# Patient Record
Sex: Female | Born: 1985 | Race: Black or African American | Hispanic: No | Marital: Single | Smoking: Never smoker
Health system: Southern US, Community
[De-identification: ages and names within clinical notes are randomized; demographics above are authoritative.]

## PROBLEM LIST (undated history)

## (undated) DIAGNOSIS — N809 Endometriosis, unspecified: Secondary | ICD-10-CM

---

## 2013-03-10 DIAGNOSIS — F1210 Cannabis abuse, uncomplicated: Secondary | ICD-10-CM | POA: Insufficient documentation

## 2013-05-26 DIAGNOSIS — Z309 Encounter for contraceptive management, unspecified: Secondary | ICD-10-CM | POA: Insufficient documentation

## 2013-06-28 DIAGNOSIS — Z9851 Tubal ligation status: Secondary | ICD-10-CM | POA: Insufficient documentation

## 2014-11-16 DIAGNOSIS — N806 Endometriosis in cutaneous scar: Secondary | ICD-10-CM | POA: Insufficient documentation

## 2014-11-16 DIAGNOSIS — N920 Excessive and frequent menstruation with regular cycle: Secondary | ICD-10-CM | POA: Insufficient documentation

## 2014-11-16 DIAGNOSIS — N643 Galactorrhea not associated with childbirth: Secondary | ICD-10-CM | POA: Insufficient documentation

## 2014-11-16 DIAGNOSIS — D500 Iron deficiency anemia secondary to blood loss (chronic): Secondary | ICD-10-CM | POA: Insufficient documentation

## 2015-01-03 DIAGNOSIS — E0590 Thyrotoxicosis, unspecified without thyrotoxic crisis or storm: Secondary | ICD-10-CM | POA: Insufficient documentation

## 2020-09-05 ENCOUNTER — Ambulatory Visit
Admission: RE | Admit: 2020-09-05 | Discharge: 2020-09-05 | Disposition: A | Discharge status: Home / Self Care | Payer: Self-pay | Source: Ambulatory Visit | Attending: Emergency Medicine | Admitting: Emergency Medicine

## 2020-09-05 ENCOUNTER — Other Ambulatory Visit: Payer: Self-pay

## 2020-09-05 VITALS — BP 112/75 | HR 93 | Temp 98.3°F | Resp 19

## 2020-09-05 DIAGNOSIS — R0602 Shortness of breath: Secondary | ICD-10-CM

## 2020-09-05 DIAGNOSIS — K2900 Acute gastritis without bleeding: Secondary | ICD-10-CM

## 2020-09-05 HISTORY — DX: Endometriosis, unspecified: N80.9

## 2020-09-05 MED ORDER — ALUM & MAG HYDROXIDE-SIMETH 200-200-20 MG/5ML PO SUSP
30.0000 mL | Freq: Once | ORAL | Status: AC
  Administered 2020-09-05: 30 mL via ORAL

## 2020-09-05 MED ORDER — PANTOPRAZOLE SODIUM 40 MG PO TBEC: 40.0000 mg | DELAYED_RELEASE_TABLET | Freq: Two times a day (BID) | ORAL | 0 refills | Status: DC

## 2020-09-05 MED ORDER — LIDOCAINE VISCOUS HCL 2 % MT SOLN
15.0000 mL | Freq: Once | OROMUCOSAL | Status: AC
  Administered 2020-09-05: 15 mL via ORAL

## 2020-09-05 MED ORDER — SUCRALFATE 1 GM/10ML PO SUSP: 1.0000 g | Freq: Four times a day (QID) | ORAL | 0 refills | Status: DC

## 2020-09-05 MED ORDER — FAMOTIDINE 20 MG PO TABS: 20.0000 mg | ORAL_TABLET | Freq: Two times a day (BID) | ORAL | 0 refills | Status: DC

## 2020-09-05 NOTE — ED Triage Notes (Signed)
Pt presents with complaints of indigestion that started on Monday. Reports she ate a sand which and laid down. Reports she immediately felt hungry even though she had just ate. States she has continued to have a "hungry feeling" in the top of her stomach all day long. Reports the sensation is worse when she takes a deep breath or pushes in on her abdomen. Reports she feels relief with belching. Denies taking any otc medications.

## 2020-09-05 NOTE — ED Provider Notes (Signed)
HPI  SUBJECTIVE:  Christina Huang is a 34 y.o. female who presents with 3 days of intermittent upper abdominal pain described as "hunger pains".  She states it is located in the epigastric/ substernal region, it is sharp, burning, occasionally radiating to both shoulders.  It does not radiate into her jaw or down her arm. it lasts minutes, but will come and go all day.  She reports waterbrash, belching.  She states it starts while she is eating.  She reports some shortness of breath when the pain is at its worst.  States that she was taking a lot of Aleve last week due to menstrual pain.  No accompanying diaphoresis, nausea, vomiting, coughing, wheezing, palpitations, or other abdominal pain.  No abdominal distention.  No exertional component.  No change in physical activity, recent trauma to the area, recent heavy lifting.  She tried Tylenol and sleeping.  Symptoms are better with belching.  They are worse with taking a deep breath in, eating, lying down, palpation and twisting her torso to the left.  She has a past medical history of endometriosis and C-sections x4.  No history of GERD, peptic ulcer disease, atrial fibrillation, hypercoagulability, exogenous estrogen, diabetes, hypertension, hypercholesterolemia smoking, obesity, DVT, PE, mesenteric ischemia, EtOH use..  Family history significant for an aunt with an MI at age 17.  No first-degree relatives with early MI.  LMP: Last week.  Denies possibility of being pregnant.  She is status post bilateral tubal ligation.  PMD: None.   Past Medical History:  Diagnosis Date  . Endometriosis     Past Surgical History:  Procedure Laterality Date  . CESAREAN SECTION WITH BILATERAL TUBAL LIGATION      Family History  Problem Relation Age of Onset  . Healthy Mother   . Healthy Father     Social History   Tobacco Use  . Smoking status: Never Smoker  . Smokeless tobacco: Never Used  Substance Use Topics  . Alcohol use: Never  . Drug use: Yes     Types: Marijuana    No current facility-administered medications for this encounter.  Current Outpatient Medications:  .  famotidine (PEPCID) 20 MG tablet, Take 1 tablet (20 mg total) by mouth 2 (two) times daily., Disp: 60 tablet, Rfl: 0 .  pantoprazole (PROTONIX) 40 MG tablet, Take 1 tablet (40 mg total) by mouth 2 (two) times daily., Disp: 30 tablet, Rfl: 0 .  sucralfate (CARAFATE) 1 GM/10ML suspension, Take 10 mLs (1 g total) by mouth 4 (four) times daily. 10 mL before meals and before bedtime, Disp: 240 mL, Rfl: 0  No Known Allergies   ROS  As noted in HPI.   Physical Exam  BP 112/75   Pulse 93   Temp 98.3 F (36.8 C)   Resp 19   SpO2 97%   Constitutional: Well developed, well nourished, no acute distress Eyes:  EOMI, conjunctiva normal bilaterally HENT: Normocephalic, atraumatic,mucus membranes moist Respiratory: Normal inspiratory effort lungs clear bilaterally Cardiovascular: Normal rate, regular rhythm.  Positive tenderness along the lower anterior ribs that reproduces her pain. GI: Positive epigastric tenderness that reproduces her pain.  No other abdominal tenderness.  No guarding, rebound, active bowel sounds.  No pulsatile palpable masses.  skin: No rash, skin intact Musculoskeletal: no deformities Neurologic: Alert & oriented x 3, no focal neuro deficits Psychiatric: Speech and behavior appropriate   ED Course   Medications  alum & mag hydroxide-simeth (MAALOX/MYLANTA) 200-200-20 MG/5ML suspension 30 mL (30 mLs Oral Given 09/05/20 1811)  And  lidocaine (XYLOCAINE) 2 % viscous mouth solution 15 mL (15 mLs Oral Given 09/05/20 1812)    Orders Placed This Encounter  Procedures  . Ambulatory referral to Gastroenterology    Referral Priority:   Routine    Referral Type:   Consultation    Referral Reason:   Specialty Services Required    Number of Visits Requested:   1  . EKG 12-Lead    Standing Status:   Standing    Number of Occurrences:   1     No results found for this or any previous visit (from the past 24 hour(s)). No results found.  ED Clinical Impression  1. Acute gastritis without hemorrhage, unspecified gastritis type      ED Assessment/Plan  EKG: Normal sinus rhythm, rate 67.  Normal axis, normal intervals.  No hypertrophy.  No ST-T wave changes.  No previous EKG for comparison.  Patient was symptomatic while  EKG was obtained.  Suspect a gastritis or PUD.  Doubt cardiac cause due to normal EKG with active symptoms.  Doubt mesenteric ischemia as she has no risk factors.  Gave GI cocktail here with significant improvement in symptoms.  Also sending home with Pepcid, Protonix, Carafate, GI follow-up if she does not get better in a week to 10 days.  Advised her to avoid NSAIDs.  will also provide primary care list for ongoing care.  Strict ER return precautions given.  Discussed MDM, treatment plan, and plan for follow-up with patient. Discussed sn/sx that should prompt return to the ED. patient agrees with plan.   Meds ordered this encounter  Medications  . AND Linked Order Group   . alum & mag hydroxide-simeth (MAALOX/MYLANTA) 200-200-20 MG/5ML suspension 30 mL   . lidocaine (XYLOCAINE) 2 % viscous mouth solution 15 mL  . famotidine (PEPCID) 20 MG tablet    Sig: Take 1 tablet (20 mg total) by mouth 2 (two) times daily.    Dispense:  60 tablet    Refill:  0  . pantoprazole (PROTONIX) 40 MG tablet    Sig: Take 1 tablet (40 mg total) by mouth 2 (two) times daily.    Dispense:  30 tablet    Refill:  0  . sucralfate (CARAFATE) 1 GM/10ML suspension    Sig: Take 10 mLs (1 g total) by mouth 4 (four) times daily. 10 mL before meals and before bedtime    Dispense:  240 mL    Refill:  0    *This clinic note was created using Scientist, clinical (histocompatibility and immunogenetics). Therefore, there may be occasional mistakes despite careful proofreading.   ?    Domenick Gong, MD 09/06/20 1528

## 2020-09-05 NOTE — Discharge Instructions (Addendum)
Do not take any more Aleve, ibuprofen, BC powder, or any other NSAID until your symptoms resolve.  You can try the Pepcid, Protonix.  Carafate will coat your stomach, but it may be expensive.  Follow with a primary care provider of your choice for routine care soon as you can, and will up with gastroenterology if you do not get better with this treatment in a week.  Here is a list of primary care providers who are taking new patients:  Dr. Elizabeth Sauer [address] [place] [address] [phone]  [facility] [address] [place], [place] [address] [phone]  [facility] [place] [address] [place] [address]  [phone]  [facility] [address] [place], [place] [address] [phone]  [facility] [address] [place]  [phone] [place], [place] [address]  Here are clinics/ other resources who will see you if you do not have insurance. Some have certain criteria that you must meet. Call them and find out what they are:  [facility]: [address], [place], [place] [address] Phone: [phone] Hours: First and Third Saturdays of each Month, 9 a.m. - 1 p.m.  [facility]: [address], [address] [place], [place] [address] Phone: [phone] Hours: Tuesday, 4 p.m. - 8 p.m. Thursday, 1 p.m. - 8 p.m. Wednesday, 9 a.m. - [facility] [address], [place], [place] [address] Phone: [phone] Pharmacy Phone Number: [phone] Dental Phone Number: [phone] [facility] Insurance Help: [phone]  Dental Hours: Monday - Thursday, 8 a.m. - 6 p.m.  [facility] [address], [place], [place] [address] Phone: [phone] Pharmacy Phone Number: [phone] [facility] Insurance Help: [phone]  [facility] [address] [place], [place] [address] Phone: [phone] Pharmacy Phone Number: [phone] [facility] Insurance Help:  [phone]  [facility] [address] [place], [place] [address] Phone: [phone] [facility] Insurance Help: [phone]   [facility] [address], [place], [place] [address] Phone: [phone]  Go to www.goodrx.com to look up your medications. This will give you a list of where you can find your prescriptions at the most affordable prices. Or ask the pharmacist what the cash price is, or if they have any other discount programs available to help make your medication more affordable. This can be less expensive than what you would pay with insurance.

## 2020-10-22 ENCOUNTER — Encounter: Payer: Self-pay | Admitting: *Deleted

## 2020-10-22 ENCOUNTER — Ambulatory Visit: Payer: Self-pay | Admitting: Gastroenterology

## 2021-07-06 ENCOUNTER — Ambulatory Visit: Payer: Self-pay

## 2021-07-07 ENCOUNTER — Other Ambulatory Visit: Payer: Self-pay

## 2021-07-07 ENCOUNTER — Ambulatory Visit
Admission: RE | Admit: 2021-07-07 | Discharge: 2021-07-07 | Disposition: A | Discharge status: Home / Self Care | Payer: 59 | Source: Ambulatory Visit | Attending: Emergency Medicine | Admitting: Emergency Medicine

## 2021-07-07 VITALS — BP 119/71 | HR 80 | Temp 98.2°F | Resp 14 | Ht 62.0 in | Wt 105.0 lb

## 2021-07-07 DIAGNOSIS — Z20822 Contact with and (suspected) exposure to covid-19: Secondary | ICD-10-CM | POA: Insufficient documentation

## 2021-07-07 DIAGNOSIS — J069 Acute upper respiratory infection, unspecified: Secondary | ICD-10-CM

## 2021-07-07 MED ORDER — IPRATROPIUM BROMIDE 0.06 % NA SOLN: 2.0000 | Freq: Four times a day (QID) | NASAL | 12 refills | Status: DC

## 2021-07-07 NOTE — ED Triage Notes (Signed)
Patient c/o headache, runny nose, congestion that started over a week ago.  Patient had a PCR test on Wed and was negative.  Patient reports low grade fevers off and on.

## 2021-07-07 NOTE — ED Provider Notes (Signed)
[facility]    CSN: [id number] Arrival date & time: 07/07/21  2440      History   Chief Complaint Chief Complaint  Patient presents with   Nasal Congestion   Headache    HPI Christina Huang is a 35 y.o. female.   HPI  50 old female here for evaluation of respiratory complaints.  Patient reports that she developed a headache, runny nose, and nasal congestion over a week ago.  She was tested 4 days ago and had a negative COVID PCR test.  She states that she just wants to be sure that she does not have anything infectious.  She states that she had a fever with a T-max of 102 that broke 2 days ago and has not had 1 since.  She is experiencing fatigue.  She indicates that she is having a yellow nasal discharge with some intermittent flecks of blood.  She is also complaining of a left sided headache and light sensitivity.  She did have a sore throat but that too has resolved and she is also complaining of intermittent diarrhea.  She denies ear pain, cough, shortness of breath or wheezing, nausea, or vomiting.  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      Home Medications    Prior to Admission medications   Medication Sig Start Date End Date Taking? Authorizing Provider  ipratropium (ATROVENT) 0.06 % nasal spray Place 2 sprays into both nostrils 4 (four) times daily. 07/07/21  Yes Becky Augusta, NP  levonorgestrel (MIRENA) 20  MCG/DAY IUD 1 each by Intrauterine route once.   Yes [provider]    Family History Family History  Problem Relation Age of Onset   Healthy Mother    Healthy Father     Social History Social History   Tobacco Use   Smoking status: Never   Smokeless tobacco: Never  Vaping Use   Vaping Use: Never used  Substance Use Topics   Alcohol use: Never   Drug use: Yes    Types: Marijuana     Allergies   Patient has no known allergies.   Review of Systems Review of Systems  Constitutional:  Positive for fatigue and fever. Negative for activity change and appetite change.  HENT:  Positive for congestion, rhinorrhea and sore throat. Negative for ear discharge and ear pain.   Respiratory:  Negative for cough, shortness of breath and wheezing.   Gastrointestinal:  Positive for diarrhea. Negative for nausea and vomiting.  Musculoskeletal:  Negative for arthralgias and neck pain.  Skin:  Negative for rash.  Neurological:  Positive for headaches.  Hematological: Negative.   Psychiatric/Behavioral: Negative.      Physical Exam Triage Vital Signs ED Triage Vitals  Enc Vitals Group     BP 07/07/21 0913 119/71     Pulse Rate 07/07/21 0913 80     Resp 07/07/21 0913 14     Temp 07/07/21  0913 98.2 F (36.8 C)     Temp Source 07/07/21 0913 Oral     SpO2 07/07/21 0913 100 %     Weight 07/07/21 0910 105 lb (47.6 kg)     Height 07/07/21 0910 5\' 2"  (1.575 m)     Head Circumference --      Peak Flow --      Pain Score 07/07/21 0910 10     Pain Loc --      Pain Edu? --      Excl. in GC? --    No data found.  Updated Vital Signs BP 119/71 (BP Location: Right Arm)   Pulse 80   Temp 98.2 F (36.8 C) (Oral)   Resp 14   Ht 5\' 2"  (1.575 m)   Wt 105 lb (47.6 kg)   SpO2 100%   BMI 19.20 kg/m   Visual Acuity Right Eye Distance:   Left Eye Distance:   Bilateral Distance:    Right Eye Near:   Left Eye Near:    Bilateral Near:     Physical Exam Vitals and nursing  note reviewed.  Constitutional:      General: She is not in acute distress.    Appearance: Normal appearance. She is not ill-appearing.  HENT:     Head: Normocephalic and atraumatic.     Right Ear: Tympanic membrane, ear canal and external ear normal. There is no impacted cerumen.     Left Ear: Tympanic membrane, ear canal and external ear normal. There is no impacted cerumen.     Nose: Congestion present. No rhinorrhea.     Mouth/Throat:     Mouth: Mucous membranes are moist.     Pharynx: Oropharynx is clear. No posterior oropharyngeal erythema.  Cardiovascular:     Rate and Rhythm: Normal rate and regular rhythm.     Pulses: Normal pulses.     Heart sounds: Normal heart sounds. No murmur heard.   No gallop.  Pulmonary:     Effort: Pulmonary effort is normal.     Breath sounds: Normal breath sounds. No wheezing, rhonchi or rales.  Abdominal:     General: Abdomen is flat. Bowel sounds are normal.     Palpations: Abdomen is soft.     Tenderness: There is no abdominal tenderness. There is no guarding or rebound.  Musculoskeletal:     Cervical back: Normal range of motion and neck supple.  Lymphadenopathy:     Cervical: No cervical adenopathy.  Skin:    General: Skin is warm and dry.     Capillary Refill: Capillary refill takes less than 2 seconds.     Findings: No erythema or rash.  Neurological:     General: No focal deficit present.     Mental Status: She is alert and oriented to person, place, and time.  Psychiatric:        Mood and Affect: Mood normal.        Behavior: Behavior normal.        Thought Content: Thought content normal.        Judgment: Judgment normal.     UC Treatments / Results  Labs (all labs ordered are listed, but only abnormal results are displayed) Labs Reviewed  SARS CORONAVIRUS 2 (TAT 6-24 HRS)    EKG   Radiology No results found.  Procedures Procedures (including critical care time)  Medications Ordered in UC Medications - No data  to display  Initial Impression / Assessment and Plan / UC Course  I have reviewed the triage vital signs and the nursing notes.  Pertinent labs & imaging results that were available during my care of the patient were reviewed by me and considered in my medical decision making (see chart for details).  Patient is a nontoxic-appearing 35 year old female here for reevaluation for possible COVID after testing negative 4 days ago despite having symptoms for over a week.  Patient's physical exam reveals pearly gray tympanic membranes bilaterally with normal light reflex and clear external auditory canals.  Nasal mucosa is pale and edematous without any discharge noted.  Oropharyngeal exam is benign.  No cervical lymphadenopathy on exam.  Cardiopulmonary exam reveals clear lung sounds in all fields.  Abdomen is flat, soft, nontender, with positive bowel sounds in all 4 quadrants.  Will swab patient for COVID and have her isolate at home pending the results.  I will give her Atrovent nasal spray to help with the nasal congestion.  She is not having any cough or wheezing at this time.  Return to ER precautions reviewed with patient.   Final Clinical Impressions(s) / UC Diagnoses   Final diagnoses:  Upper respiratory tract infection, unspecified type     Discharge Instructions      Isolate at home pending the results of your COVID test.  If you test positive then you will have to quarantine for 5 days from the start of your symptoms.  After 5 days you can break quarantine if your symptoms have improved and you have not had a fever for 24 hours without taking Tylenol or ibuprofen.  Use over-the-counter Tylenol and ibuprofen as needed for body aches and fever.  Use the Atrovent nasal spray, 2 squirts in each nostril every 6 hours, as needed for nasal congestion.  If you develop any increased shortness of breath-especially at rest, you are unable to speak in full sentences, or is a late sign your lips  are turning blue you need to go the ER for evaluation.      ED Prescriptions     Medication Sig Dispense Auth. Provider   ipratropium (ATROVENT) 0.06 % nasal spray Place 2 sprays into both nostrils 4 (four) times daily. 15 mL Becky Augusta, NP      PDMP not reviewed this encounter.   Becky Augusta, NP 07/07/21 [phone]

## 2021-07-07 NOTE — Discharge Instructions (Addendum)
Isolate at home pending the results of your COVID test.  If you test positive then you will have to quarantine for 5 days from the start of your symptoms.  After 5 days you can break quarantine if your symptoms have improved and you have not had a fever for 24 hours without taking Tylenol or ibuprofen.  Use over-the-counter Tylenol and ibuprofen as needed for body aches and fever.  Use the Atrovent nasal spray, 2 squirts in each nostril every 6 hours, as needed for nasal congestion.  If you develop any increased shortness of breath-especially at rest, you are unable to speak in full sentences, or is a late sign your lips are turning blue you need to go the ER for evaluation.

## 2021-07-08 LAB — SARS CORONAVIRUS 2 (TAT 6-24 HRS): SARS Coronavirus 2: NEGATIVE

## 2021-07-18 ENCOUNTER — Inpatient Hospital Stay
Admission: RE | Admit: 2021-07-18 | Discharge: 2021-07-18 | Disposition: A | Discharge status: Home / Self Care | Payer: 59 | Source: Ambulatory Visit

## 2021-07-18 ENCOUNTER — Telehealth: Payer: Self-pay | Admitting: Physician Assistant

## 2021-07-18 NOTE — Telephone Encounter (Signed)
ERROR

## 2021-07-22 ENCOUNTER — Emergency Department: Payer: 59

## 2021-07-22 ENCOUNTER — Emergency Department
Admission: EM | Admit: 2021-07-22 | Discharge: 2021-07-22 | Disposition: A | Discharge status: Home / Self Care | Payer: 59 | Attending: Emergency Medicine | Admitting: Emergency Medicine

## 2021-07-22 ENCOUNTER — Other Ambulatory Visit: Payer: Self-pay

## 2021-07-22 DIAGNOSIS — R519 Headache, unspecified: Secondary | ICD-10-CM | POA: Insufficient documentation

## 2021-07-22 DIAGNOSIS — R112 Nausea with vomiting, unspecified: Secondary | ICD-10-CM | POA: Diagnosis not present

## 2021-07-22 DIAGNOSIS — N390 Urinary tract infection, site not specified: Secondary | ICD-10-CM | POA: Diagnosis not present

## 2021-07-22 DIAGNOSIS — B9689 Other specified bacterial agents as the cause of diseases classified elsewhere: Secondary | ICD-10-CM | POA: Insufficient documentation

## 2021-07-22 DIAGNOSIS — R0981 Nasal congestion: Secondary | ICD-10-CM | POA: Diagnosis not present

## 2021-07-22 DIAGNOSIS — G43901 Migraine, unspecified, not intractable, with status migrainosus: Secondary | ICD-10-CM

## 2021-07-22 LAB — URINALYSIS, COMPLETE (UACMP) WITH MICROSCOPIC
Bilirubin Urine: NEGATIVE
Glucose, UA: NEGATIVE mg/dL
Hgb urine dipstick: NEGATIVE
Ketones, ur: >80 mg/dL — AB
Leukocytes,Ua: NEGATIVE
Nitrite: POSITIVE — AB
Protein, ur: 30 mg/dL — AB
Specific Gravity, Urine: >1.03 — ABNORMAL HIGH (ref 1.005–1.030)
pH: 6.5 (ref 5.0–8.0)

## 2021-07-22 LAB — CBC
HCT: 37.1 % (ref 36.0–46.0)
Hemoglobin: 13.1 g/dL (ref 12.0–15.0)
MCH: 32.8 pg (ref 26.0–34.0)
MCHC: 35.3 g/dL (ref 30.0–36.0)
MCV: 93 fL (ref 80.0–100.0)
Platelets: 267 10*3/uL (ref 150–400)
RBC: 3.99 MIL/uL (ref 3.87–5.11)
RDW: 14.8 % (ref 11.5–15.5)
WBC: 6.4 10*3/uL (ref 4.0–10.5)
nRBC: 0 % (ref 0.0–0.2)

## 2021-07-22 LAB — COMPREHENSIVE METABOLIC PANEL
ALT: 11 U/L (ref 0–44)
AST: 21 U/L (ref 15–41)
Albumin: 4.4 g/dL (ref 3.5–5.0)
Alkaline Phosphatase: 55 U/L (ref 38–126)
Anion gap: 8 (ref 5–15)
BUN: 11 mg/dL (ref 6–20)
CO2: 24 mmol/L (ref 22–32)
Calcium: 9.7 mg/dL (ref 8.9–10.3)
Chloride: 105 mmol/L (ref 98–111)
Creatinine, Ser: 0.65 mg/dL (ref 0.44–1.00)
GFR, Estimated: >60 mL/min (ref >60)
Glucose, Bld: 105 mg/dL — ABNORMAL HIGH (ref 70–99)
Potassium: 3.6 mmol/L (ref 3.5–5.1)
Sodium: 137 mmol/L (ref 135–145)
Total Bilirubin: 1 mg/dL (ref 0.3–1.2)
Total Protein: 8.2 g/dL — ABNORMAL HIGH (ref 6.5–8.1)

## 2021-07-22 LAB — LIPASE, BLOOD: Lipase: 24 U/L (ref 11–51)

## 2021-07-22 LAB — POC URINE PREG, ED: Preg Test, Ur: NEGATIVE

## 2021-07-22 MED ORDER — ONDANSETRON 4 MG PO TBDP
4.0000 mg | ORAL_TABLET | Freq: Once | ORAL | Status: AC
  Administered 2021-07-22: 4 mg via ORAL
  Filled 2021-07-22: qty 1

## 2021-07-22 MED ORDER — BUTALBITAL-APAP-CAFFEINE 50-325-40 MG PO TABS
2.0000 | ORAL_TABLET | Freq: Once | ORAL | Status: AC
  Administered 2021-07-22: 2 via ORAL
  Filled 2021-07-22: qty 2

## 2021-07-22 MED ORDER — ONDANSETRON 4 MG PO TBDP: 4.0000 mg | ORAL_TABLET | Freq: Three times a day (TID) | ORAL | 0 refills | Status: DC | PRN

## 2021-07-22 MED ORDER — NITROFURANTOIN MONOHYD MACRO 100 MG PO CAPS: 100.0000 mg | ORAL_CAPSULE | Freq: Two times a day (BID) | ORAL | 0 refills | Status: AC

## 2021-07-22 NOTE — ED Notes (Signed)
Patient transported to CT 

## 2021-07-22 NOTE — ED Triage Notes (Signed)
Pt to ED for headaches and emesis since end of august. Dx with sinus infection.   Hx of migraines and headaches

## 2021-07-22 NOTE — ED Provider Notes (Signed)
[facility] Emergency Department Provider Note  Time seen: 11:32 AM  I have reviewed the triage vital signs and the nursing notes.   HISTORY  Chief Complaint Headache and Emesis   HPI Christina Huang is a 35 y.o. female with a past medical history of endometriosis, migraines, presents to the emergency department for right-sided headache.  According to the patient since the end of August she has been experiencing intermittent headaches along with significant nasal congestion.  Patient was prescribed medications for nasal/sinus infection at the end of August.  Patient states essentially she has not been back to work due to her symptoms, tried to go back to work yesterday but states by the evening developed a headache.  Patient continued to have a headache today including some nausea and vomiting so the patient came to the emergency department for evaluation.  Denies any cough.  Denies any fever.  Patient states this headache feels different than her typical headaches she has been getting, states it is more intense.  Patient states she could not go to work today due to the headache so she came to the emergency department for evaluation.   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 Authorizing Provider  ipratropium (ATROVENT) 0.06 % nasal spray Place 2 sprays into both nostrils 4 (four) times daily.  07/07/21   Becky Augusta, NP  levonorgestrel (MIRENA) 20 MCG/DAY IUD 1 each by Intrauterine route once.    [provider]    No Known Allergies  Family History  Problem Relation Age of Onset   Healthy Mother    Healthy Father     Social History Social History   Tobacco Use   Smoking status: Never   Smokeless tobacco: Never  Vaping Use   Vaping Use: Never used  Substance Use Topics   Alcohol use: Never   Drug use: Yes    Types: Marijuana    Review of Systems Constitutional: Negative for fever. ENT: Patient continues to have sinus/nasal congestion although she states is much improved compared to 2 weeks ago. Cardiovascular: Negative for chest pain. Respiratory: Negative for shortness of breath. Gastrointestinal: Negative for abdominal pain.  Positive for an episode of vomiting today. Musculoskeletal: Negative for musculoskeletal complaints Neurological: Moderate right-sided headache All other ROS negative  ____________________________________________   PHYSICAL EXAM:  VITAL SIGNS: ED Triage Vitals  Enc Vitals Group     BP 07/22/21 0923 127/89     Pulse Rate 07/22/21 0923 (!) 127     Resp 07/22/21 0923 20     Temp 07/22/21 0923 98.4 F (36.9 C)     Temp Source 07/22/21 0923 Oral     SpO2 07/22/21 0923 99 %     Weight 07/22/21 0924 110 lb (49.9 kg)     Height 07/22/21 0924 5\' 3"  (1.6 m)     Head Circumference --      Peak Flow --  Pain Score 07/22/21 0924 10     Pain Loc --      Pain Edu? --      Excl. in GC? --    Constitutional: Alert and oriented. Well appearing and in no distress. Eyes: Normal exam ENT      Head: Normocephalic and atraumatic.      Nose: Mild nasal congestion/rhinorrhea      Mouth/Throat: Mucous membranes are moist. Cardiovascular: Normal rate, regular rhythm.  Respiratory: Normal respiratory effort without tachypnea nor retractions. Breath sounds are clear Gastrointestinal: Soft and nontender. No distention.   Musculoskeletal: Nontender with normal range of motion in all extremities.  Neurologic:  Normal speech and language. No gross focal neurologic deficits  Skin:  Skin is warm, dry and intact.  Psychiatric: Mood and affect are normal.   ____________________________________________   INITIAL IMPRESSION / ASSESSMENT AND PLAN / ED COURSE  Pertinent labs & imaging results that were available during my care of the patient were reviewed by me and considered in my medical decision making (see chart for details).   Patient presents emergency department for headache with an episode nausea vomiting this morning and continued sinus congestion per patient.  Patient is using a prescribed anti-inflammatory nasal spray which she states has helped significantly.  Patient states her main concern today is right-sided headache.  States it was hurting to look at light and hearing loud noises, states she could not go to work so she came to the emergency department for evaluation.  Patient describes a headache as moderate aching type pain to the right side only.  States this feels different than her typical headaches she has been getting.  Patient's vitals are reassuring.  Blood work is reassuring.  We will obtain CT imaging of the head as a precaution.  We will treat with Fioricet and Zofran.  Patient agreeable.  Urinalysis shows urinary tract infection as well as some ketones.  Patient states she is feeling much better.  We will discharge with a nausea medication as well as an antibiotic.  Patient agreeable to plan of care.  CT scan head is reassuring.  Christina Huang was evaluated in Emergency Department on 07/22/2021 for the symptoms described in the history of present illness. She was evaluated in the context of the global COVID-19 pandemic, which necessitated consideration that the patient might be at risk for infection with the SARS-CoV-2 virus that causes COVID-19. Institutional protocols and algorithms that  pertain to the evaluation of patients at risk for COVID-19 are in a state of rapid change based on information released by regulatory bodies including the CDC and federal and state organizations. These policies and algorithms were followed during the patient's care in the ED.  ____________________________________________   FINAL CLINICAL IMPRESSION(S) / ED DIAGNOSES  Headache Urinary tract infection   Minna Antis, MD 07/22/21 1411

## 2021-07-22 NOTE — ED Notes (Signed)
ED Provider at bedside. 

## 2021-07-22 NOTE — ED Notes (Signed)
Patient states that the pain medicine helped her headache but still has the pressure in her head.

## 2021-08-03 ENCOUNTER — Other Ambulatory Visit: Payer: Self-pay

## 2021-08-03 ENCOUNTER — Ambulatory Visit
Admission: RE | Admit: 2021-08-03 | Discharge: 2021-08-03 | Disposition: A | Discharge status: Home / Self Care | Payer: 59 | Source: Ambulatory Visit | Attending: Emergency Medicine | Admitting: Emergency Medicine

## 2021-08-03 VITALS — BP 112/80 | HR 89 | Temp 98.6°F | Resp 14 | Ht 63.0 in | Wt 115.0 lb

## 2021-08-03 DIAGNOSIS — N758 Other diseases of Bartholin's gland: Secondary | ICD-10-CM | POA: Insufficient documentation

## 2021-08-03 DIAGNOSIS — N898 Other specified noninflammatory disorders of vagina: Secondary | ICD-10-CM | POA: Insufficient documentation

## 2021-08-03 LAB — POCT URINALYSIS DIP (DEVICE)
Bilirubin Urine: NEGATIVE
Glucose, UA: NEGATIVE mg/dL
Hgb urine dipstick: NEGATIVE
Ketones, ur: NEGATIVE mg/dL
Leukocytes,Ua: NEGATIVE
Nitrite: NEGATIVE
Protein, ur: NEGATIVE mg/dL
Specific Gravity, Urine: <=1.005 (ref 1.005–1.030)
Urobilinogen, UA: 0.2 mg/dL (ref 0.0–1.0)
pH: 6 (ref 5.0–8.0)

## 2021-08-03 LAB — POC URINE PREG, ED: Preg Test, Ur: NEGATIVE

## 2021-08-03 MED ORDER — DOXYCYCLINE HYCLATE 100 MG PO CAPS: 100.0000 mg | ORAL_CAPSULE | Freq: Two times a day (BID) | ORAL | 0 refills | Status: AC

## 2021-08-03 MED ORDER — IBUPROFEN 600 MG PO TABS: 600.0000 mg | ORAL_TABLET | Freq: Four times a day (QID) | ORAL | 0 refills | Status: DC | PRN

## 2021-08-03 MED ORDER — METRONIDAZOLE 500 MG PO TABS: 500.0000 mg | ORAL_TABLET | Freq: Three times a day (TID) | ORAL | 0 refills | Status: AC

## 2021-08-03 NOTE — Discharge Instructions (Addendum)
Take 600 mg of ibuprofen, 1000 mg of Tylenol together 3-4 times a day as needed for pain.  Sitz baths as often as you want.  Once this starts to drain, your symptoms will get much better.  Finish the Flagyl and doxycycline, even if you feel better.  Your testing will be back in several days.  Flagyl will treat BV.  Please follow-up with your OB/GYN ASAP, you may need to have this drained.

## 2021-08-03 NOTE — ED Triage Notes (Signed)
Patient states that she noticed a tender knot on the outside of her vagina about 3 days ago.  Patient states that she has also had bloody discharge from her vagina.  Patient states that since then she has had more increase abdominal pain.  Patient denies N/V/D.

## 2021-08-03 NOTE — ED Provider Notes (Signed)
HPI  SUBJECTIVE:  Christina Huang is a 35 y.o. female who presents with a painful knot on her left labia that is getting bigger starting 3 days ago.  She reports vaginal odor, dark red discharge/bleeding.  She shaves her genitalia, but denies trauma to the area.  She reports crampy pelvic pain for the past several weeks which has gotten worse over the past 3 days.  States it radiates to her back.  She is in a long-term monogamous relationship with a female who is asymptomatic.  STDs are not a concern today.  No urinary complaints, fevers.  she has tried rest.  Symptoms are worse with sitting up, walking and better with lying on her right side.  She has never had symptoms like this before.  She has a remote history of BV, yeast, UTI.  No history of MRSA, diabetes, chronic kidney disease, STDs, pyelonephritis, PID, ovarian cyst, ovarian torsion, TOA.  LMP: She is irregular due to an IUD.  She is status post bilateral tubal ligation.  Denies possibility being pregnant.  OB/GYN: [facility] [place].   Past Medical History:  Diagnosis Date   Endometriosis     Past Surgical History:  Procedure Laterality Date   CESAREAN SECTION WITH BILATERAL TUBAL LIGATION      Family History  Problem Relation Age of Onset   Healthy Mother    Healthy Father     Social History   Tobacco Use   Smoking status: Never   Smokeless tobacco: Never  Vaping Use   Vaping Use: Never used  Substance Use Topics   Alcohol use: Never   Drug use: Yes    Types: Marijuana    No current facility-administered medications for this encounter.  Current Outpatient Medications:    doxycycline (VIBRAMYCIN) 100 MG capsule, Take 1 capsule (100 mg total) by mouth 2 (two) times daily for 7 days., Disp: 14 capsule, Rfl: 0   ibuprofen (ADVIL) 600 MG tablet, Take 1 tablet (600 mg total) by mouth every 6 (six) hours as needed., Disp: 30 tablet, Rfl: 0   levonorgestrel (MIRENA) 20 MCG/DAY IUD, 1 each by Intrauterine route once., Disp: ,  Rfl:    metroNIDAZOLE (FLAGYL) 500 MG tablet, Take 1 tablet (500 mg total) by mouth 3 (three) times daily for 7 days., Disp: 21 tablet, Rfl: 0   ondansetron (ZOFRAN ODT) 4 MG disintegrating tablet, Take 1 tablet (4 mg total) by mouth every 8 (eight) hours as needed for nausea or vomiting., Disp: 20 tablet, Rfl: 0  No Known Allergies   ROS  As noted in HPI.   Physical Exam  BP 112/80 (BP Location: Left Arm)   Pulse 89   Temp 98.6 F (37 C) (Oral)   Resp 14   Ht 5\' 3"  (1.6 m)   Wt 52.2 kg   SpO2 99%   BMI 20.37 kg/m   Constitutional: Well developed, well nourished, no acute distress Eyes:  EOMI, conjunctiva normal bilaterally HENT: Normocephalic, atraumatic,mucus membranes moist Respiratory: Normal inspiratory effort Cardiovascular: Normal rate GI: nondistended, soft, mild suprapubic tenderness.  Active bowel sounds.  No rebound, guarding. back: No CVA tenderness GU: Tender nonerythematous mass approximately 3 cm left lower labia consistent with Bartholin gland cyst.  No expressible purulent drainage.  Normal vaginal mucosa.  Normal os.  Brownish/bloody/mucoid discharge from the os.  IUD strings visualized.   Uterus smooth, tender. No  CMT. No adnexal tenderness. No adnexal masses.  Chaperone present during exam skin: No rash, skin intact Musculoskeletal: no deformities  Neurologic: Alert & oriented x 3, no focal neuro deficits Psychiatric: Speech and behavior appropriate   ED Course   Medications - No data to display  Orders Placed This Encounter  Procedures   Pelvic exam    Standing Status:   Standing    Number of Occurrences:   1   After Hours POC Urinalysis Dipstick    Standing Status:   Standing    Number of Occurrences:   1   POCT urinalysis dip (device)    Standing Status:   Standing    Number of Occurrences:   1   After Hours POC Urine Preg    Standing Status:   Standing    Number of Occurrences:   1    Results for orders placed or performed during the  hospital encounter of 08/03/21 (from the past 24 hour(s))  POCT urinalysis dip (device)     Status: None   Collection Time: 08/03/21 11:24 AM  Result Value Ref Range   Glucose, UA NEGATIVE NEGATIVE mg/dL   Bilirubin Urine NEGATIVE NEGATIVE   Ketones, ur NEGATIVE NEGATIVE mg/dL   Specific Gravity, Urine <=1.005 1.005 - 1.030   Hgb urine dipstick NEGATIVE NEGATIVE   pH 6.0 5.0 - 8.0   Protein, ur NEGATIVE NEGATIVE mg/dL   Urobilinogen, UA 0.2 0.0 - 1.0 mg/dL   Nitrite NEGATIVE NEGATIVE   Leukocytes,Ua NEGATIVE NEGATIVE  After Hours POC Urine Preg     Status: None   Collection Time: 08/03/21 12:49 PM  Result Value Ref Range   Preg Test, Ur Negative Negative   No results found.  ED Clinical Impression  1. Bartholin's gland infection   2. Vaginal discharge     ED Assessment/Plan  Patient with a Bartholin gland cyst versus abscess.  There is no expressible purulent drainage.  I also suspect that she has BV.  Vaginal swab sent.  I think that the low midline abdominal cramping is the beginning of menses.  No evidence of PID, intra-abdominal process.  Her urine is negative for UTI.  She is not pregnant.   Will send home with Flagyl 500 mg 3 times daily and doxycycline for 7 days, Tylenol/ibuprofen.  Follow-up with her OB/GYN ASAP.  Work note for Sunday and Monday.  She has Tuesday Wednesday off.  Discussed labs, medical decision making, treatment plan and plan for follow-up with patient.  She agrees with plan.  Meds ordered this encounter  Medications   doxycycline (VIBRAMYCIN) 100 MG capsule    Sig: Take 1 capsule (100 mg total) by mouth 2 (two) times daily for 7 days.    Dispense:  14 capsule    Refill:  0   metroNIDAZOLE (FLAGYL) 500 MG tablet    Sig: Take 1 tablet (500 mg total) by mouth 3 (three) times daily for 7 days.    Dispense:  21 tablet    Refill:  0   ibuprofen (ADVIL) 600 MG tablet    Sig: Take 1 tablet (600 mg total) by mouth every 6 (six) hours as needed.     Dispense:  30 tablet    Refill:  0    *This clinic note was created using Scientist, clinical (histocompatibility and immunogenetics). Therefore, there may be occasional mistakes despite careful proofreading.  ?     Domenick Gong, MD 08/03/21 1253

## 2021-08-05 LAB — CERVICOVAGINAL ANCILLARY ONLY
Bacterial Vaginitis (gardnerella): POSITIVE — AB
Candida Glabrata: NEGATIVE
Candida Vaginitis: NEGATIVE
Chlamydia: NEGATIVE
Comment: NEGATIVE
Comment: NEGATIVE
Comment: NEGATIVE
Comment: NEGATIVE
Comment: NEGATIVE
Comment: NORMAL
Neisseria Gonorrhea: NEGATIVE
Trichomonas: NEGATIVE

## 2021-10-22 ENCOUNTER — Ambulatory Visit: Payer: Self-pay

## 2022-02-10 ENCOUNTER — Other Ambulatory Visit: Payer: Self-pay

## 2022-02-10 DIAGNOSIS — M79661 Pain in right lower leg: Secondary | ICD-10-CM | POA: Insufficient documentation

## 2022-02-10 DIAGNOSIS — M5431 Sciatica, right side: Secondary | ICD-10-CM | POA: Insufficient documentation

## 2022-02-10 DIAGNOSIS — X58XXXA Exposure to other specified factors, initial encounter: Secondary | ICD-10-CM | POA: Diagnosis not present

## 2022-02-10 DIAGNOSIS — S8991XA Unspecified injury of right lower leg, initial encounter: Secondary | ICD-10-CM | POA: Diagnosis present

## 2022-02-10 DIAGNOSIS — S86911A Strain of unspecified muscle(s) and tendon(s) at lower leg level, right leg, initial encounter: Secondary | ICD-10-CM | POA: Diagnosis not present

## 2022-02-10 NOTE — ED Triage Notes (Signed)
Pt presents to ER c/o right leg pain x2 days.  Pt states she had bartholin cyst removed 3/13 at [facility] and states she was doing yard work 2 days ago when the pain started.  Pt states pain is in posterior right leg and radiates up her right leg.  Pt has good pms and temperature noted to right leg at this time.  Pt is A&O x4 at this time in NAD.   ?

## 2022-02-11 ENCOUNTER — Emergency Department: Payer: Medicaid Other

## 2022-02-11 ENCOUNTER — Emergency Department
Admission: EM | Admit: 2022-02-11 | Discharge: 2022-02-11 | Disposition: A | Discharge status: Home / Self Care | Payer: Medicaid Other | Attending: Emergency Medicine | Admitting: Emergency Medicine

## 2022-02-11 DIAGNOSIS — M5431 Sciatica, right side: Secondary | ICD-10-CM

## 2022-02-11 DIAGNOSIS — M79604 Pain in right leg: Secondary | ICD-10-CM

## 2022-02-11 DIAGNOSIS — T148XXA Other injury of unspecified body region, initial encounter: Secondary | ICD-10-CM

## 2022-02-11 LAB — COMPREHENSIVE METABOLIC PANEL
ALT: 11 U/L (ref 0–44)
AST: 19 U/L (ref 15–41)
Albumin: 3.8 g/dL (ref 3.5–5.0)
Alkaline Phosphatase: 65 U/L (ref 38–126)
Anion gap: 4 — ABNORMAL LOW (ref 5–15)
BUN: 9 mg/dL (ref 6–20)
CO2: 24 mmol/L (ref 22–32)
Calcium: 8.7 mg/dL — ABNORMAL LOW (ref 8.9–10.3)
Chloride: 109 mmol/L (ref 98–111)
Creatinine, Ser: 0.5 mg/dL (ref 0.44–1.00)
GFR, Estimated: >60 mL/min (ref >60)
Glucose, Bld: 95 mg/dL (ref 70–99)
Potassium: 3.8 mmol/L (ref 3.5–5.1)
Sodium: 137 mmol/L (ref 135–145)
Total Bilirubin: 0.3 mg/dL (ref 0.3–1.2)
Total Protein: 7.4 g/dL (ref 6.5–8.1)

## 2022-02-11 LAB — CBC WITH DIFFERENTIAL/PLATELET
Abs Immature Granulocytes: 0 10*3/uL (ref 0.00–0.07)
Basophils Absolute: 0 10*3/uL (ref 0.0–0.1)
Basophils Relative: 1 %
Eosinophils Absolute: 0.1 10*3/uL (ref 0.0–0.5)
Eosinophils Relative: 1 %
HCT: 36.9 % (ref 36.0–46.0)
Hemoglobin: 12.1 g/dL (ref 12.0–15.0)
Immature Granulocytes: 0 %
Lymphocytes Relative: 66 %
Lymphs Abs: 2.4 10*3/uL (ref 0.7–4.0)
MCH: 30.9 pg (ref 26.0–34.0)
MCHC: 32.8 g/dL (ref 30.0–36.0)
MCV: 94.4 fL (ref 80.0–100.0)
Monocytes Absolute: 0.2 10*3/uL (ref 0.1–1.0)
Monocytes Relative: 6 %
Neutro Abs: 1 10*3/uL — ABNORMAL LOW (ref 1.7–7.7)
Neutrophils Relative %: 26 %
Platelets: 257 10*3/uL (ref 150–400)
RBC: 3.91 MIL/uL (ref 3.87–5.11)
RDW: 13 % (ref 11.5–15.5)
WBC: 3.7 10*3/uL — ABNORMAL LOW (ref 4.0–10.5)
nRBC: 0 % (ref 0.0–0.2)

## 2022-02-11 LAB — URINALYSIS, ROUTINE W REFLEX MICROSCOPIC
Bilirubin Urine: NEGATIVE
Glucose, UA: NEGATIVE mg/dL
Hgb urine dipstick: NEGATIVE
Ketones, ur: NEGATIVE mg/dL
Leukocytes,Ua: NEGATIVE
Nitrite: NEGATIVE
Protein, ur: NEGATIVE mg/dL
Specific Gravity, Urine: 1.013 (ref 1.005–1.030)
pH: 6 (ref 5.0–8.0)

## 2022-02-11 LAB — CK: Total CK: 88 U/L (ref 38–234)

## 2022-02-11 LAB — LIPASE, BLOOD: Lipase: 28 U/L (ref 11–51)

## 2022-02-11 LAB — POC URINE PREG, ED: Preg Test, Ur: NEGATIVE

## 2022-02-11 MED ORDER — CYCLOBENZAPRINE HCL 5 MG PO TABS: ORAL_TABLET | ORAL | 0 refills | Status: DC

## 2022-02-11 MED ORDER — PREDNISONE 20 MG PO TABS
30.0000 mg | ORAL_TABLET | Freq: Once | ORAL | Status: AC
  Administered 2022-02-11: 30 mg via ORAL
  Filled 2022-02-11: qty 1

## 2022-02-11 MED ORDER — NAPROXEN 500 MG PO TABS: 500.0000 mg | ORAL_TABLET | Freq: Two times a day (BID) | ORAL | 0 refills | Status: DC

## 2022-02-11 MED ORDER — OXYCODONE-ACETAMINOPHEN 5-325 MG PO TABS
1.0000 | ORAL_TABLET | Freq: Once | ORAL | Status: AC
  Administered 2022-02-11: 1 via ORAL
  Filled 2022-02-11: qty 1

## 2022-02-11 MED ORDER — METHYLPREDNISOLONE 4 MG PO TBPK: ORAL_TABLET | ORAL | 0 refills | Status: DC

## 2022-02-11 MED ORDER — KETOROLAC TROMETHAMINE 30 MG/ML IJ SOLN
10.0000 mg | Freq: Once | INTRAMUSCULAR | Status: AC
  Administered 2022-02-11: 9.9 mg via INTRAVENOUS
  Filled 2022-02-11: qty 1

## 2022-02-11 MED ORDER — SODIUM CHLORIDE 0.9 % IV BOLUS
1000.0000 mL | Freq: Once | INTRAVENOUS | Status: AC
  Administered 2022-02-11: 1000 mL via INTRAVENOUS

## 2022-02-11 MED ORDER — HYDROCODONE-ACETAMINOPHEN 5-325 MG PO TABS: 1.0000 | ORAL_TABLET | Freq: Four times a day (QID) | ORAL | 0 refills | Status: DC | PRN

## 2022-02-11 NOTE — ED Provider Notes (Signed)
? ?[facility] ?Provider Note ? ? ? Event Date/Time  ? First MD Initiated Contact with Patient 02/11/22 0209   ?  (approximate) ? ? ?History  ? ?Leg Pain ? ? ?HPI ? ?Christina Huang is a 36 y.o. female who presents to the ED from home with a chief complaint of right leg pain x2 days.  Patient reports she was helping her family do yard work over the weekend.  She was mostly sitting on a low-lying stool pulling weeds.  Did do some frequent bending action as well.  Denies fall or striking her back or leg.  Reports pain to her anterior knee and thigh radiating to her posterior buttock and lower back.  Denies extremity weakness, numbness or tingling.  Denies bowel or bladder incontinence.  Did say she vomited yesterday but none today.  She thinks vomiting was due to the pain.  Denies fever, chills, cough, chest pain, shortness of breath, abdominal pain, dysuria or diarrhea.  Of note, patient had [facility] Bar[facility]olin [facility]  No issues since I&D. ?  ? ? ?Past Medical History  ? ?Past Medical History:  ?Diagnosis Date  ? Endometriosis   ? ? ? ?Active Problem List  ? ?Patient Active Problem List  ? Diagnosis Date Noted  ? Hyperthyroidism, subclinical 01/03/2015  ? 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 Iron deficiency anemia due to chronic blood loss 11/16/2014  ? Menorrhagia with regular cycle 11/16/2014  ? S/P tubal ligation 06/28/2013  ? Contraception management 05/26/2013  ? Marijuana abuse 03/10/2013  ? ? ? ?Past Surgical History  ? ?Past Surgical History:  ?Procedure Laterality Date  ? CESAREAN SECTION WITH BILATERAL TUBAL LIGATION    ? ? ? ?Home Medications  ? ?Prior to Admission medications   ?Medication Sig Start Date End Date Taking? Authorizing Provider  ?cyclobenzaprine (FLEXERIL) 5 MG tablet 1 tablet every 8 hours as needed for muscle spasms 02/11/22  Yes Irean HongSung, Philip Kotlyar J, MD  ?HYDROcodone-acetaminophen (NORCO) 5-325 MG tablet Take 1 tablet by  mouth every 6 (six) hours as needed for moderate pain. 02/11/22  Yes Irean HongSung, Adebayo Ensminger J, MD  ?methylPREDNISolone (MEDROL DOSEPAK) 4 MG TBPK tablet Take as directed 02/11/22  Yes Irean HongSung, Kennis Wissmann J, MD  ?naproxen (NAPROSYN) 500 MG tablet Take 1 tablet (500 mg total) by mouth 2 (two) times daily with a meal. 02/11/22  Yes Irean HongSung, Cordelia Bessinger J, MD  ?ibuprofen (ADVIL) 600 MG tablet Take 1 tablet (600 mg total) by mouth every 6 (six) hours as needed. 08/03/21   Domenick GongMortenson, Ashley, MD  ?levonorgestrel (MIRENA) 20 MCG/DAY IUD 1 each by Intrauterine route once.    [provider]  ?ondansetron (ZOFRAN ODT) 4 MG disintegrating tablet Take 1 tablet (4 mg total) by mouth every 8 (eight) hours as needed for nausea or vomiting. 07/22/21   Minna AntisPaduchowski, Kevin, MD  ? ? ? ?Allergies  ?Patient has no known allergies. ? ? ?Family History  ? ?Family History  ?Problem Relation Age of Onset  ? Healthy Mother   ? Healthy Father   ? ? ? ?Physical Exam  ?Triage Vital Signs: ?ED Triage Vitals  ?Enc Vitals Group  ?   BP 02/10/22 2211 116/85  ?   Pulse Rate 02/10/22 2211 76  ?   Resp 02/10/22 2211 16  ?   Temp 02/10/22 2211 97.9 ?F (36.6 ?C)  ?   Temp Source 02/10/22 2211 Oral  ?   SpO2 02/10/22 2211 98 %  ?  Weight 02/10/22 2212 115 lb (52.2 kg)  ?   Height 02/10/22 2212 5\' 6"  (1.676 m)  ?   Head Circumference --   ?   Peak Flow --   ?   Pain Score 02/10/22 2211 10  ?   Pain Loc --   ?   Pain Edu? --   ?   Excl. in GC? --   ? ? ?Updated Vital Signs: ?BP 120/79   Pulse 75   Temp 97.9 ?F (36.6 ?C) (Oral)   Resp 17   Ht 5\' 6"  (1.676 m)   Wt 52.2 kg   SpO2 100%   BMI 18.56 kg/m?  ? ? ?General: Awake, no distress.  ?CV:  RRR.  Good peripheral perfusion.  ?Resp:  Normal effort.  CTA B. ?Abd:  Nontender to light or deep palpation.  No distention.  ?Other:  RLE: Tender to palpation posterior knee, anterior thigh, right buttock.  Limited range of motion secondary to pain.  2+ femoral and distal pulses.  Brisk, less than 5-second capillary refill.  5/5 motor  strength and sensation BLE. ? ? ?ED Results / Procedures / Treatments  ?Labs ?(all labs ordered are listed, but only abnormal results are displayed) ?Labs Reviewed  ?CBC WITH DIFFERENTIAL/PLATELET - Abnormal; Notable for the following components:  ?    Result Value  ? WBC 3.7 (*)   ? Neutro Abs 1.0 (*)   ? All other components within normal limits  ?COMPREHENSIVE METABOLIC PANEL - Abnormal; Notable for the following components:  ? Calcium 8.7 (*)   ? Anion gap 4 (*)   ? All other components within normal limits  ?URINALYSIS, ROUTINE W REFLEX MICROSCOPIC - Abnormal; Notable for the following components:  ? Color, Urine YELLOW (*)   ? APPearance CLEAR (*)   ? All other components within normal limits  ?CK  ?LIPASE, BLOOD  ?POC URINE PREG, ED  ? ? ? ?EKG ? ?None ? ? ?RADIOLOGY ?I have independently visualized and reviewed patient's ultrasound as well as noted the radiology interpretation: ? ?RLE ultrasound: No DVT ? ?Official radiology report(s): ?2212 Venous Img Lower Unilateral Right (DVT) ? ?Result Date: 02/11/2022 ?CLINICAL DATA:  Right leg pain EXAM: RIGHT LOWER EXTREMITY VENOUS DOPPLER ULTRASOUND TECHNIQUE: Gray-scale sonography with compression, as well as color and duplex ultrasound, were performed to evaluate the deep venous system(s) from the level of the common femoral vein through the popliteal and proximal calf veins. COMPARISON:  None. FINDINGS: VENOUS Normal compressibility of the common femoral, superficial femoral, and popliteal veins, as well as the visualized calf veins. Visualized portions of profunda femoral vein and great saphenous vein unremarkable. No filling defects to suggest DVT on grayscale or color Doppler imaging. Doppler waveforms show normal direction of venous flow, normal respiratory plasticity and response to augmentation. Limited views of the contralateral common femoral vein are unremarkable. OTHER None. Limitations: none IMPRESSION: Negative. Electronically Signed   By: [place]  M.D.   On: 02/11/2022 03:18   ? ? ?PROCEDURES: ? ?Critical Care performed: No ? ?Procedures ? ? ?MEDICATIONS ORDERED IN ED: ?Medications  ?sodium chloride 0.9 % bolus 1,000 mL (0 mLs Intravenous Stopped 02/11/22 0422)  ?ketorolac (TORADOL) 30 MG/ML injection 9.9 mg (9.9 mg Intravenous Given 02/11/22 0241)  ?oxyCODONE-acetaminophen (PERCOCET/ROXICET) 5-325 MG per tablet 1 tablet (1 tablet Oral Given 02/11/22 0241)  ?predniSONE (DELTASONE) tablet 30 mg (30 mg Oral Given 02/11/22 0241)  ?oxyCODONE-acetaminophen (PERCOCET/ROXICET) 5-325 MG per tablet 1 tablet (1 tablet Oral Given 02/11/22  0349)  ? ? ? ?IMPRESSION / MDM / ASSESSMENT AND PLAN / ED COURSE  ?I reviewed the triage vital signs and the nursing notes. ?             ?               ?36 year old female presenting with right lower leg pain.  Differential diagnosis includes but is not limited to sciatica, DVT, rhabdomyolysis, musculoskeletal, etc. ? ?Will obtain lab work including CK, DVT ultrasound.  Check LFTs/lipase and urine since patient vomited yesterday.  Initiate IV fluid hydration, IV ketorolac, oral Percocet, start prednisone for steroid taper.  Will reassess. ? ?Clinical Course as of 02/11/22 0707  ?Tue Feb 11, 2022  ?0350 Patient feeling better.  Updated her of all test results demonstrating unremarkable WBC, normal electrolytes and CK, negative urine.  No DVT on ultrasound.  Will discharge home on steroid taper, NSAIDs, analgesia, muscle relaxer and patient will follow up with orthopedics.  Strict return precautions given.  Patient verbalizes understanding and agrees with plan of care. [JS]  ?0411 POCT was negative [JS]  ?  ?Clinical Course User Index ?[JS] Irean Hong, MD  ? ? ? ?FINAL CLINICAL IMPRESSION(S) / ED DIAGNOSES  ? ?Final diagnoses:  ?Right leg pain  ?Sciatica of right side  ?Muscle strain  ? ? ? ?Rx / DC Orders  ? ?ED Discharge Orders   ? ?      Ordered  ?  naproxen (NAPROSYN) 500 MG tablet  2 times daily with meals       ? 02/11/22 0352  ?   methylPREDNISolone (MEDROL DOSEPAK) 4 MG TBPK tablet       ? 02/11/22 0352  ?  HYDROcodone-acetaminophen (NORCO) 5-325 MG tablet  Every 6 hours PRN       ? 02/11/22 0352  ?  cyclobenzaprine (FLEXERIL) 5 MG tablet

## 2022-02-11 NOTE — Discharge Instructions (Addendum)
1.  Take steroid taper as prescribed (Medrol Dosepak). ?2.  You may take medicines as needed for pain and muscle spasms (Naprosyn, Norco, Flexeril). ?3.  Return to the ER for worsening symptoms, persistent vomiting, difficulty breathing or other concerns. ?

## 2022-04-30 ENCOUNTER — Ambulatory Visit: Admit: 2022-04-30 | Discharge status: Absent without leave | Payer: Medicaid Other

## 2022-05-01 ENCOUNTER — Ambulatory Visit
Admission: EM | Admit: 2022-05-01 | Discharge: 2022-05-01 | Disposition: A | Discharge status: Home / Self Care | Payer: Medicaid Other | Attending: Family Medicine | Admitting: Family Medicine

## 2022-05-01 DIAGNOSIS — U071 COVID-19: Secondary | ICD-10-CM

## 2022-05-01 DIAGNOSIS — Z1152 Encounter for screening for COVID-19: Secondary | ICD-10-CM | POA: Diagnosis not present

## 2022-05-01 DIAGNOSIS — R112 Nausea with vomiting, unspecified: Secondary | ICD-10-CM

## 2022-05-01 DIAGNOSIS — E860 Dehydration: Secondary | ICD-10-CM

## 2022-05-01 LAB — POCT URINALYSIS DIP (MANUAL ENTRY)
Glucose, UA: NEGATIVE mg/dL
Leukocytes, UA: NEGATIVE
Nitrite, UA: NEGATIVE
Protein Ur, POC: >=300 mg/dL — AB
Spec Grav, UA: >=1.03 — AB (ref 1.010–1.025)
Urobilinogen, UA: 1 E.U./dL
pH, UA: 5.5 (ref 5.0–8.0)

## 2022-05-01 MED ORDER — METOCLOPRAMIDE HCL 5 MG/ML IJ SOLN
5.0000 mg | INTRAMUSCULAR | Status: AC
  Administered 2022-05-01: 5 mg via INTRAMUSCULAR

## 2022-05-01 MED ORDER — ONDANSETRON 4 MG PO TBDP: 4.0000 mg | ORAL_TABLET | Freq: Three times a day (TID) | ORAL | 0 refills | Status: DC | PRN

## 2022-05-01 NOTE — ED Triage Notes (Addendum)
Patient presents to Urgent Care with complaints of chills, urinary freq, fever, body aches, intermittent fever, and vomiting since 04/27/22. Tested positive for COVID. Employer req PCR test. Treating symptoms with mucinex and zofran.

## 2022-05-01 NOTE — ED Provider Notes (Signed)
[name]    CSN: [id number] Arrival date & time: 05/01/22  0920      History   Chief Complaint Chief Complaint  Patient presents with   Fever   Chills   Emesis   Urinary Frequency   Generalized Body Aches    HPI Christina Huang is a 36 y.o. female.   HPI Patient presents today for PCR COVID testing.  Testing positive for COVID with the self-administration of symptoms. She tested positive on 04/27/22. Symptoms included chills, fever, body aches, nausea with vomiting. She is currently afebrile. Employer needs PCR test to return to work. Unrelated she is concern for a possible UTI as she has experienced urinary frequency. Concern for possible UTI.  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      Home Medications    Prior to Admission medications   Medication Sig Start Date End Date Taking? Authorizing Provider  cyclobenzaprine (FLEXERIL) 5 MG tablet 1 tablet every 8 hours as needed for muscle spasms 02/11/22   Irean Hong, MD  HYDROcodone-acetaminophen (NORCO) 5-325 MG tablet Take 1 tablet by mouth every 6 (six) hours as needed for moderate pain. 02/11/22   Irean Hong, MD  ibuprofen (ADVIL) 600 MG tablet Take 1 tablet (600 mg total) by mouth every 6 (six) hours as needed. 08/03/21   Domenick Gong, MD  levonorgestrel (MIRENA) 20 MCG/DAY IUD 1 each by Intrauterine route once.    [provider]  methylPREDNISolone  (MEDROL DOSEPAK) 4 MG TBPK tablet Take as directed 02/11/22   Irean Hong, MD  naproxen (NAPROSYN) 500 MG tablet Take 1 tablet (500 mg total) by mouth 2 (two) times daily with a meal. 02/11/22   Irean Hong, MD  ondansetron (ZOFRAN ODT) 4 MG disintegrating tablet Take 1 tablet (4 mg total) by mouth every 8 (eight) hours as needed for nausea or vomiting. 07/22/21   Minna Antis, MD    Family History Family History  Problem Relation Age of Onset   Healthy Mother    Healthy Father     Social History Social History   Tobacco Use   Smoking status: Never   Smokeless tobacco: Never  Vaping Use   Vaping Use: Never used  Substance Use Topics   Alcohol use: Never   Drug use: Yes    Types: Marijuana     Allergies   Patient has no known allergies.   Review of Systems Review of Systems Pertinent negatives listed in HPI   Physical Exam Triage Vital Signs ED Triage Vitals [05/01/22 0933]  Enc Vitals Group     BP 118/78     Pulse Rate 97     Resp 18     Temp 98.2 F (36.8 C)     Temp src      SpO2 98 %     Weight      Height      Head  Circumference      Peak Flow      Pain Score      Pain Loc      Pain Edu?      Excl. in [place]?    No data found.  Updated Vital Signs BP 118/78   Pulse 97   Temp 98.2 F (36.8 C)   Resp 18   SpO2 98%   Visual Acuity Right Eye Distance:   Left Eye Distance:   Bilateral Distance:    Right Eye Near:   Left Eye Near:    Bilateral Near:     Physical Exam General appearance: Alert, appear ill, no acute distress Head: Normocephalic, without obvious abnormality, atraumatic Heart: Rate and rhythm normal.   Respiratory: Respirations even and unlabored, normal respiratory rate CVA:  no flank pain Extremities: No gross deformities Skin: Skin color, texture, turgor normal. No rashes seen  Psych: Appropriate mood and affect.  UC Treatments / Results  Labs (all labs ordered are listed, but only abnormal results are displayed) Labs  Reviewed  NOVEL CORONAVIRUS, NAA  POCT URINALYSIS DIP (MANUAL ENTRY)    EKG   Radiology No results found.  Procedures Procedures (including critical care time)  Medications Ordered in UC Medications - No data to display  Initial Impression / Assessment and Plan / UC Course  I have reviewed the triage vital signs and the nursing notes.  Pertinent labs & imaging results that were available during my care of the patient were reviewed by me and considered in my medical decision making (see chart for details).    PCR COVID test is pending.  UA indicates dehydration, however, not enough urine for culture. Symptom management warranted. Advised to hydrate well with fluids. Continue to rest. Tylenol and or ibuprofen as needed for body aches.  ER if red flags symptoms develop. Follow-up as needed. Final Clinical Impressions(s) / UC Diagnoses   Final diagnoses:  Positive self-administered antigen test for COVID-19  Encounter for screening for COVID-19   Discharge Instructions   None    ED Prescriptions   None    PDMP not reviewed this encounter.   Bing Neighbors, FNP 05/06/22 1931

## 2022-05-01 NOTE — Discharge Instructions (Addendum)
Your COVID 19 results will be available in 48-72 hours. Negative results are immediately resulted to Mychart. Positive results will receive a follow-up call from our clinic. I have sent Zofran you can take in 4-6 hours if needed for nausea.  You received Reglan injection for management of nausea while here in clinic. You are mildly dehydrated therefore recommend drinking plenty of fluids either water or Gatorade.  Avoid excessive caffeine as this can worsen hydration status. Is very concentrated this can also be related to the recent illness and vomiting.

## 2022-05-03 LAB — NOVEL CORONAVIRUS, NAA: SARS-CoV-2, NAA: DETECTED — AB

## 2022-06-18 ENCOUNTER — Ambulatory Visit: Payer: Self-pay

## 2022-08-11 ENCOUNTER — Ambulatory Visit: Payer: Self-pay

## 2022-08-12 ENCOUNTER — Ambulatory Visit: Payer: Self-pay

## 2022-08-13 ENCOUNTER — Ambulatory Visit: Payer: Self-pay

## 2022-08-14 ENCOUNTER — Encounter: Payer: Self-pay | Admitting: Emergency Medicine

## 2022-08-14 ENCOUNTER — Ambulatory Visit: Payer: Self-pay

## 2022-08-14 ENCOUNTER — Ambulatory Visit
Admission: EM | Admit: 2022-08-14 | Discharge: 2022-08-14 | Disposition: A | Discharge status: Home / Self Care | Payer: Medicaid Other | Attending: Emergency Medicine | Admitting: Emergency Medicine

## 2022-08-14 ENCOUNTER — Ambulatory Visit (INDEPENDENT_AMBULATORY_CARE_PROVIDER_SITE_OTHER): Payer: Medicaid Other

## 2022-08-14 DIAGNOSIS — M25531 Pain in right wrist: Secondary | ICD-10-CM

## 2022-08-14 DIAGNOSIS — J069 Acute upper respiratory infection, unspecified: Secondary | ICD-10-CM

## 2022-08-14 DIAGNOSIS — R059 Cough, unspecified: Secondary | ICD-10-CM | POA: Diagnosis not present

## 2022-08-14 DIAGNOSIS — W19XXXA Unspecified fall, initial encounter: Secondary | ICD-10-CM | POA: Diagnosis not present

## 2022-08-14 DIAGNOSIS — R0602 Shortness of breath: Secondary | ICD-10-CM | POA: Diagnosis not present

## 2022-08-14 MED ORDER — AMOXICILLIN-POT CLAVULANATE 875-125 MG PO TABS: 1.0000 | ORAL_TABLET | Freq: Two times a day (BID) | ORAL | 0 refills | Status: AC

## 2022-08-14 MED ORDER — PROMETHAZINE-DM 6.25-15 MG/5ML PO SYRP: 5.0000 mL | ORAL_SOLUTION | Freq: Four times a day (QID) | ORAL | 0 refills | Status: DC | PRN

## 2022-08-14 MED ORDER — PREDNISONE 20 MG PO TABS: 40.0000 mg | ORAL_TABLET | Freq: Every day | ORAL | 0 refills | Status: DC

## 2022-08-14 MED ORDER — BENZONATATE 100 MG PO CAPS: 100.0000 mg | ORAL_CAPSULE | Freq: Three times a day (TID) | ORAL | 0 refills | Status: DC

## 2022-08-14 NOTE — Discharge Instructions (Addendum)
For your cold symptoms - On exam your throat is red but there is no lightheadedness, there is congestion in your nose, use appears to be, your lungs are clear and you are getting enough to air, Your heart is beating in a normal pace and rhythm, as your symptoms have been present for 2 weeks without resolution we will start an antibiotic for bacterial coverage -Begin Augmentin every morning and every evening for 10 days, complete all medicine even if you feel better -Begin prednisone every morning with food for 5 days, this is going to reduce inflammation and irritation throughout the body so it will help with your abdominal muscles, your wrist pain and your lungs -May use Tessalon pill every 8 hours as needed to help, -May use cough syrup every 6 hours for additional comfort, be mindful this medication -You can take Tylenol and/or Ibuprofen as needed for fever reduction and pain relief.  -For cough: honey 1/2 to 1 teaspoon (you can dilute the honey in water or another fluid).  You can also use guaifenesin for cough. You can use a humidifier for chest congestion and cough.  If you don't have a humidifier, you can sit in the bathroom with the hot shower running.     -For sore throat: try warm salt water gargles, cepacol lozenges, throat spray, warm tea or water with lemon/honey, popsicles or ice, or OTC cold relief medicine for throat discomfort.  -For congestion: take a daily anti-histamine like Zyrtec, Claritin, and a oral decongestant, such as pseudoephedrine.  You can also use Flonase 1-2 sprays in each nostril daily.  -It is important to stay hydrated: drink plenty of fluids (water, gatorade/powerade/pedialyte, juices, or teas) to keep your throat moisturized and help further relieve irritation/discomfort.   For your wrist -X-ray is negative therefore pain should improve with time -Prednisone has been ordered will help reduce inflammation to the wrist and help with your pain -Monitor the knot that  is on your hand, it is most likely  a non harmful cyst -You may place ice or heat over the affected area in 10 to 15-minute intervals -You may purchase a wrist brace from the store and use as needed for stability and support -Your symptoms continue to persist please follow-up with orthopedics, information on front page

## 2022-08-14 NOTE — ED Triage Notes (Addendum)
Pt presents with cough, chest congestion, nasal congestion and SOB x 2 weeks. Pt took 2 at home test that were negative.   Pt also c/o wrist pain after a fall yesterday.

## 2022-08-14 NOTE — ED Provider Notes (Signed)
[facility]    CSN: [id number] Arrival date & time: 08/14/22  0859      History   Chief Complaint Chief Complaint  Patient presents with   Cough    I've had this cough for a week and a half and it's to the point where I feel like something isn't right in my stomach a burning sensation and I just fell and hit my right hand trying to hold my stomach - Entered by patient   Wrist Pain    HPI Christina Huang is a 35 y.o. female.   Patient presents with fever chills, body aches, nasal congestion, rhinorrhea, sore throat, nonproductive cough, intermittent shortness of breath and wheezing for 2 weeks.  Ear popping can be felt only when blowing nose.  Sore throat is worsened by coughing.  Endorses abdominal soreness and a sensation that something is popping in and out of the right lower quadrant when coughing, endorses that she was told that she had a hernia in a similar area that has resolved.  Attempted use of Mucinex DM right and TheraFlu which have been minimally effective.  Denies respiratory history.  She endorses that 1 day ago she was holding the right side of her abdomen when she slipped and fell landing on the back of her hand.  Has had pain with movement since.  Endorses tingling to the entirety of the hand.  Has not attempted treatment.    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      Home Medications    Prior to  Admission medications   Medication Sig Start Date End Date Taking? Authorizing Provider  levonorgestrel (MIRENA) 20 MCG/DAY IUD 1 each by Intrauterine route once.   Yes [provider]  cyclobenzaprine (FLEXERIL) 5 MG tablet 1 tablet every 8 hours as needed for muscle spasms 02/11/22   Paulette Blanch, MD  HYDROcodone-acetaminophen (NORCO) 5-325 MG tablet Take 1 tablet by mouth every 6 (six) hours as needed for moderate pain. 02/11/22   Paulette Blanch, MD  ibuprofen (ADVIL) 600 MG tablet Take 1 tablet (600 mg total) by mouth every 6 (six) hours as needed. 08/03/21   Melynda Ripple, MD  methylPREDNISolone (MEDROL DOSEPAK) 4 MG TBPK tablet Take as directed 02/11/22   Paulette Blanch, MD  naproxen (NAPROSYN) 500 MG tablet Take 1 tablet (500 mg total) by mouth 2 (two) times daily with a meal. 02/11/22   Paulette Blanch, MD  ondansetron (ZOFRAN-ODT) 4 MG disintegrating tablet Take 1 tablet (4 mg total) by mouth every 8 (eight) hours as needed for nausea or vomiting. 05/01/22   Scot Jun, FNP    Family History Family History  Problem Relation Age of Onset   Healthy Mother    Healthy Father     Social History Social History   Tobacco Use   Smoking status: Never   Smokeless tobacco: Never  Vaping Use  Vaping Use: Some days  Substance Use Topics   Alcohol use: Never   Drug use: Yes    Types: Marijuana     Allergies   Patient has no known allergies.   Review of Systems Review of Systems  Constitutional:  Positive for appetite change, chills and fever. Negative for activity change, diaphoresis, fatigue and unexpected weight change.  HENT:  Positive for congestion, ear pain and rhinorrhea. Negative for dental problem, drooling, ear discharge, facial swelling, hearing loss, mouth sores, nosebleeds, postnasal drip, sinus pressure, sinus pain, sneezing, sore throat, tinnitus, trouble swallowing and voice change.   Respiratory:  Positive for cough, shortness of breath and wheezing. Negative  for apnea, choking, chest tightness and stridor.   Cardiovascular: Negative.   Gastrointestinal: Negative.   Musculoskeletal:  Positive for myalgias. Negative for arthralgias, back pain, gait problem, joint swelling, neck pain and neck stiffness.  Skin: Negative.      Physical Exam Triage Vital Signs ED Triage Vitals  Enc Vitals Group     BP 08/14/22 0945 117/76     Pulse Rate 08/14/22 0945 83     Resp 08/14/22 0945 18     Temp 08/14/22 0945 98.3 F (36.8 C)     Temp Source 08/14/22 0945 Oral     SpO2 08/14/22 0945 96 %     Weight --      Height --      Head Circumference --      Peak Flow --      Pain Score 08/14/22 0941 9     Pain Loc --      Pain Edu? --      Excl. in [place]? --    No data found.  Updated Vital Signs BP 117/76 (BP Location: Right Arm)   Pulse 83   Temp 98.3 F (36.8 C) (Oral)   Resp 18   SpO2 96%   Visual Acuity Right Eye Distance:   Left Eye Distance:   Bilateral Distance:    Right Eye Near:   Left Eye Near:    Bilateral Near:     Physical Exam Constitutional:      Appearance: Normal appearance.  HENT:     Head: Normocephalic.     Right Ear: Tympanic membrane, ear canal and external ear normal.     Left Ear: Tympanic membrane, ear canal and external ear normal.     Nose: Congestion and rhinorrhea present.     Mouth/Throat:     Mouth: Mucous membranes are moist.     Pharynx: Posterior oropharyngeal erythema present.  Eyes:     Extraocular Movements: Extraocular movements intact.  Cardiovascular:     Rate and Rhythm: Normal rate and regular rhythm.     Pulses: Normal pulses.     Heart sounds: Normal heart sounds.  Pulmonary:     Effort: Pulmonary effort is normal.     Breath sounds: Normal breath sounds.  Musculoskeletal:     Cervical back: Normal range of motion and neck supple.     Comments: 0.5 cm cyst present on the third metacarpal near the wrist, no ecchymosis, swelling, deformity or tenderness present to the wrist, range of  motion is intact, 2+ radial pulse, strength is a 5 out of 5  Neurological:     Mental Status: She is alert and oriented to person, place, and time. Mental status is at baseline.  Psychiatric:        Mood and Affect: Mood normal.  Behavior: Behavior normal.      UC Treatments / Results  Labs (all labs ordered are listed, but only abnormal results are displayed) Labs Reviewed - No data to display  EKG   Radiology DG Wrist Complete Right  Result Date: 08/14/2022 CLINICAL DATA:  Right wrist pain after a fall yesterday. EXAM: RIGHT WRIST - COMPLETE 3+ VIEW COMPARISON:  None Available. FINDINGS: There is no evidence of fracture or dislocation. There is no evidence of arthropathy or other focal bone abnormality. Soft tissues are unremarkable. IMPRESSION: Negative. Electronically Signed   By: Titus Dubin M.D.   On: 08/14/2022 10:13   DG Chest 2 View  Result Date: 08/14/2022 CLINICAL DATA:  Cough, congestion, and shortness of breath for the past 2 weeks. EXAM: CHEST - 2 VIEW COMPARISON:  None Available. FINDINGS: The heart size and mediastinal contours are within normal limits. Both lungs are clear. The visualized skeletal structures are unremarkable. IMPRESSION: No active cardiopulmonary disease. Electronically Signed   By: Titus Dubin M.D.   On: 08/14/2022 10:11    Procedures Procedures (including critical care time)  Medications Ordered in UC Medications - No data to display  Initial Impression / Assessment and Plan / UC Course  I have reviewed the triage vital signs and the nursing notes.  Pertinent labs & imaging results that were available during my care of the patient were reviewed by me and considered in my medical decision making (see chart for details).  Acute upper respiratory infection, acute pain of right wrist  Patient is in no signs of distress nor toxic appearing.  Vital signs are stable.  Low suspicion for pneumonia, pneumothorax or bronchitis and  therefore will defer imaging.  Home COVID testing negative, will not repeat.  Symptoms have been present for 14 days without resolution therefore we will provide bacterial coverage, chest x-ray is negative, x-ray of the right wrist is negative, discussed with patient, recommended supportive measures such as over-the-counter analgesics and RICE for wrist care. Prescribed Augmentin, prednisone, Tessalon and Promethazine DM, May use additional over-the-counter medications as needed for supportive care.  May follow-up with urgent care as needed if symptoms persist or worsen.  Final Clinical Impressions(s) / UC Diagnoses   Final diagnoses:  None   Discharge Instructions   None    ED Prescriptions   None    PDMP not reviewed this encounter.   Hans Eden, NP 08/14/22 1104

## 2022-10-21 ENCOUNTER — Ambulatory Visit: Payer: Self-pay

## 2022-12-09 ENCOUNTER — Ambulatory Visit
Admission: EM | Admit: 2022-12-09 | Discharge: 2022-12-09 | Disposition: A | Discharge status: Home / Self Care | Payer: Medicaid Other | Attending: Nurse Practitioner | Admitting: Nurse Practitioner

## 2022-12-09 ENCOUNTER — Ambulatory Visit: Payer: Self-pay

## 2022-12-09 DIAGNOSIS — R519 Headache, unspecified: Secondary | ICD-10-CM | POA: Diagnosis not present

## 2022-12-09 MED ORDER — KETOROLAC TROMETHAMINE 60 MG/2ML IM SOLN
60.0000 mg | Freq: Once | INTRAMUSCULAR | Status: AC
  Administered 2022-12-09: 60 mg via INTRAMUSCULAR

## 2022-12-09 NOTE — ED Triage Notes (Signed)
Patient to Urgent Care with complaints of left sided headache that started yesterday afternoon. Reports she woke up from a nap yesterday after work with left sided facial pain and left ear pain.   Has taken Excedrin, last dose this morning. Denies any known fevers.

## 2022-12-09 NOTE — ED Provider Notes (Signed)
[facility]    CSN: [id number] Arrival date & time: 12/09/22  1217      History   Chief Complaint No chief complaint on file.   HPI Christina Huang is a 37 y.o. female presents for evaluation of headache.  Patient reports yesterday after waking up from a nap she has had a left-sided headache that has been persistent since that time that is waxing and waning in intensity.  She describes it as a aching/pressure type pain.  It is associated with mild dizziness but denies any nausea/vomiting, syncope, visual changes, unilateral weakness.  No neck or back pain.  She does have a history of migraines but has not had one in several years.  She used to take a medication for but she does not recall the name of this medication.  She denies any URI symptoms.  No fevers or neck pain.  Denies that this is the worst headache of her life.  Denies any first-degree relative with a history of SAH.  She has taken Excedrin for her symptoms with minimal improvement.  No other concerns at this time.  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      Home Medications    Prior to Admission medications   Medication Sig Start Date End Date Taking? Authorizing Provider  benzonatate (TESSALON) 100 MG capsule Take 1 capsule (100 mg total) by mouth every 8 (eight) hours. 08/14/22   Hans Eden, NP  cyclobenzaprine (FLEXERIL) 5 MG tablet 1 tablet every 8  hours as needed for muscle spasms 02/11/22   Paulette Blanch, MD  HYDROcodone-acetaminophen (NORCO) 5-325 MG tablet Take 1 tablet by mouth every 6 (six) hours as needed for moderate pain. 02/11/22   Paulette Blanch, MD  ibuprofen (ADVIL) 600 MG tablet Take 1 tablet (600 mg total) by mouth every 6 (six) hours as needed. 08/03/21   Melynda Ripple, MD  levonorgestrel (MIRENA) 20 MCG/DAY IUD 1 each by Intrauterine route once.    [provider]  methylPREDNISolone (MEDROL DOSEPAK) 4 MG TBPK tablet Take as directed 02/11/22   Paulette Blanch, MD  naproxen (NAPROSYN) 500 MG tablet Take 1 tablet (500 mg total) by mouth 2 (two) times daily with a meal. 02/11/22   Paulette Blanch, MD  ondansetron (ZOFRAN-ODT) 4 MG disintegrating tablet Take 1 tablet (4 mg total) by mouth every 8 (eight) hours as needed for nausea or vomiting. 05/01/22   Scot Jun, NP  predniSONE (DELTASONE) 20 MG tablet Take 2 tablets (40 mg total) by mouth daily. 08/14/22   White, Leitha Schuller, NP  promethazine-dextromethorphan (PROMETHAZINE-DM) 6.25-15 MG/5ML syrup Take 5 mLs by mouth 4 (four) times daily as needed for cough. 08/14/22   Hans Eden, NP    Family History Family History  Problem Relation Age of Onset   Healthy Mother    Healthy Father     Social History Social History  Tobacco Use   Smoking status: Never   Smokeless tobacco: Never  Vaping Use   Vaping Use: Some days  Substance Use Topics   Alcohol use: Yes    Comment: ocassional   Drug use: Yes    Types: Marijuana     Allergies   Patient has no known allergies.   Review of Systems Review of Systems  Neurological:  Positive for dizziness and headaches.     Physical Exam Triage Vital Signs ED Triage Vitals  Enc Vitals Group     BP 12/09/22 1239 116/71     Pulse Rate 12/09/22 1239 73     Resp 12/09/22 1239 16     Temp 12/09/22 1239 98.2 F (36.8 C)     Temp Source 12/09/22 1239 Oral     SpO2 12/09/22 1239 96 %     Weight --      Height  --      Head Circumference --      Peak Flow --      Pain Score 12/09/22 1230 10     Pain Loc --      Pain Edu? --      Excl. in [place]? --    No data found.  Updated Vital Signs BP 116/71 (BP Location: Right Arm)   Pulse 73   Temp 98.2 F (36.8 C) (Oral)   Resp 16   SpO2 96%   Visual Acuity Right Eye Distance:   Left Eye Distance:   Bilateral Distance:    Right Eye Near:   Left Eye Near:    Bilateral Near:     Physical Exam Vitals and nursing note reviewed.  Constitutional:      Appearance: Normal appearance.  HENT:     Head: Normocephalic and atraumatic.     Right Ear: Tympanic membrane and ear canal normal.     Left Ear: Tympanic membrane and ear canal normal.  Eyes:     Extraocular Movements: Extraocular movements intact.     Conjunctiva/sclera: Conjunctivae normal.     Pupils: Pupils are equal, round, and reactive to light.     Funduscopic exam:    Right eye: Red reflex present.        Left eye: Red reflex present. Cardiovascular:     Rate and Rhythm: Normal rate.  Pulmonary:     Effort: Pulmonary effort is normal.  Musculoskeletal:     Cervical back: Normal range of motion and neck supple. No rigidity.  Skin:    General: Skin is warm and dry.  Neurological:     General: No focal deficit present.     Mental Status: She is alert and oriented to person, place, and time.     GCS: GCS eye subscore is 4. GCS verbal subscore is 5. GCS motor subscore is 6.     Cranial Nerves: No facial asymmetry.     Motor: No weakness.     Coordination: Romberg sign negative.  Psychiatric:        Mood and Affect: Mood normal.        Behavior: Behavior normal.      UC Treatments / Results  Labs (all labs ordered are listed, but only abnormal results are displayed) Labs Reviewed - No data to display  EKG   Radiology No results found.  Procedures Procedures (including critical care time)  Medications Ordered in UC Medications  ketorolac (TORADOL) injection 60 mg  (60 mg Intramuscular Given 12/09/22 1251)    Initial Impression /  Assessment and Plan / UC Course  I have reviewed the triage vital signs and the nursing notes.  Pertinent labs & imaging results that were available during my care of the patient were reviewed by me and considered in my medical decision making (see chart for details).     Patient given Toradol injection in clinic.  She was monitored for 15 minutes after injection with no reaction noted and tolerated well.Patient reports improvement in headache after injection.  She was instructed no NSAIDs OTC for 24 hours and she verbalized understanding she may continue Tylenol Discussed keeping headache diary to take to PCP Patient to follow-up with PCP in 2 to 3 days for recheck ER precautions reviewed and patient verbalized understanding Final Clinical Impressions(s) / UC Diagnoses   Final diagnoses:  Acute intractable headache, unspecified headache type     Discharge Instructions      You were given a Toradol injection in clinic today. Do not take any over the counter NSAID's such as Advil, ibuprofen, Aleve, or naproxen for 24 hours.  You may take tylenol if needed Please follow-up with your PCP in 2 to 3 days for recheck Please go to the emergency room if you have any worsening symptoms     ED Prescriptions   None    PDMP not reviewed this encounter.   Melynda Ripple, NP 12/09/22 [phone]

## 2022-12-09 NOTE — Discharge Instructions (Signed)
You were given a Toradol injection in clinic today. Do not take any over the counter NSAID's such as Advil, ibuprofen, Aleve, or naproxen for 24 hours.  You may take tylenol if needed Please follow-up with your PCP in 2 to 3 days for recheck Please go to the emergency room if you have any worsening symptoms

## 2023-01-07 ENCOUNTER — Ambulatory Visit: Payer: Self-pay

## 2023-04-06 ENCOUNTER — Ambulatory Visit
Admission: EM | Admit: 2023-04-06 | Discharge: 2023-04-06 | Disposition: A | Discharge status: Home / Self Care | Payer: BC Managed Care – PPO

## 2023-04-06 DIAGNOSIS — B349 Viral infection, unspecified: Secondary | ICD-10-CM

## 2023-04-06 NOTE — ED Triage Notes (Signed)
Patient to Urgent Care with complaints of headaches that started today. Woke up this morning at 2am vomiting. Has had some sinus pain and nasal congestion/ dry cough.  Patient took ODT zofran but threw up after.

## 2023-04-06 NOTE — Discharge Instructions (Addendum)
Keep yourself hydrated with clear liquids, such as water and Gatorade.  Follow the diarrhea diet as tolerated.   Go to the emergency department if you have worsening symptoms.    Follow up with your primary care provider.      

## 2023-04-06 NOTE — ED Provider Notes (Signed)
Christina Huang    CSN: [id number] Arrival date & time: 04/06/23  1643      History   Chief Complaint Chief Complaint  Patient presents with   Headache   Emesis    HPI Christina Huang is a 37 y.o. female.  Patient presents with headache, congestion, cough, emesis since this morning.  She took a dose of Zofran early this morning but vomited shortly after.  No emesis since this morning.  No fever, rash, sore throat, shortness of breath, diarrhea, or other symptoms.    The history is provided by the patient and medical records.    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      Home Medications    Prior to Admission medications   Medication Sig Start Date End Date Taking? Authorizing Provider  norethindrone (AYGESTIN) 5 MG tablet Take by mouth. 02/06/23 03/26/24 Yes [provider]  benzonatate (TESSALON) 100 MG capsule Take 1 capsule (100 mg total) by mouth every 8 (eight) hours. Patient not taking: Reported on 04/06/2023 08/14/22   Valinda Hoar, NP  cyclobenzaprine (FLEXERIL) 5 MG tablet 1 tablet every 8 hours as needed for muscle spasms Patient not taking: Reported on 04/06/2023 02/11/22   Irean Hong, MD  HYDROcodone-acetaminophen (NORCO) 5-325 MG tablet Take 1 tablet by mouth every 6 (six) hours as needed for moderate pain. Patient not taking: Reported on 04/06/2023 02/11/22   Irean Hong, MD  ibuprofen (ADVIL) 600 MG  tablet Take 1 tablet (600 mg total) by mouth every 6 (six) hours as needed. 08/03/21   Domenick Gong, MD  levonorgestrel (MIRENA) 20 MCG/DAY IUD 1 each by Intrauterine route once.    [provider]  methylPREDNISolone (MEDROL DOSEPAK) 4 MG TBPK tablet Take as directed Patient not taking: Reported on 04/06/2023 02/11/22   Irean Hong, MD  naproxen (NAPROSYN) 500 MG tablet Take 1 tablet (500 mg total) by mouth 2 (two) times daily with a meal. Patient not taking: Reported on 04/06/2023 02/11/22   Irean Hong, MD  ondansetron (ZOFRAN-ODT) 4 MG disintegrating tablet Take 1 tablet (4 mg total) by mouth every 8 (eight) hours as needed for nausea or vomiting. 05/01/22   Bing Neighbors, NP  predniSONE (DELTASONE) 20 MG tablet Take 2 tablets (40 mg total) by mouth daily. Patient not taking: Reported on 04/06/2023 08/14/22   Valinda Hoar, NP  promethazine-dextromethorphan (PROMETHAZINE-DM) 6.25-15 MG/5ML syrup Take 5 mLs by mouth 4 (four) times daily as needed for cough. Patient not taking: Reported on 04/06/2023 08/14/22   Valinda Hoar, NP    Family History Family History  Problem Relation Age of Onset   Healthy Mother    Healthy Father     Social History Social History   Tobacco Use   Smoking status: Never   Smokeless tobacco: Never  Vaping Use   Vaping Use: Some days  Substance Use Topics  Alcohol use: Yes    Comment: ocassional   Drug use: Yes    Types: Marijuana     Allergies   Patient has no known allergies.   Review of Systems Review of Systems  Constitutional:  Negative for chills and fever.  HENT:  Positive for congestion. Negative for ear pain and sore throat.   Respiratory:  Positive for cough. Negative for shortness of breath.   Cardiovascular:  Negative for chest pain and palpitations.  Gastrointestinal:  Positive for nausea and vomiting. Negative for abdominal pain and diarrhea.  Neurological:  Positive for headaches.     Physical Exam Triage  Vital Signs ED Triage Vitals  Enc Vitals Group     BP --      Pulse Rate 04/06/23 1726 70     Resp 04/06/23 1726 18     Temp 04/06/23 1726 98.5 F (36.9 C)     Temp src --      SpO2 04/06/23 1726 97 %     Weight --      Height --      Head Circumference --      Peak Flow --      Pain Score 04/06/23 1744 5     Pain Loc --      Pain Edu? --      Excl. in GC? --    No data found.  Updated Vital Signs BP 110/73   Pulse 70   Temp 98.5 F (36.9 C)   Resp 18   SpO2 97%   Visual Acuity Right Eye Distance:   Left Eye Distance:   Bilateral Distance:    Right Eye Near:   Left Eye Near:    Bilateral Near:     Physical Exam Vitals and nursing note reviewed.  Constitutional:      General: She is not in acute distress.    Appearance: Normal appearance. She is well-developed. She is not ill-appearing.  HENT:     Right Ear: Tympanic membrane normal.     Left Ear: Tympanic membrane normal.     Nose: Nose normal.     Mouth/Throat:     Mouth: Mucous membranes are moist.     Pharynx: Oropharynx is clear.  Cardiovascular:     Rate and Rhythm: Normal rate and regular rhythm.     Heart sounds: Normal heart sounds.  Pulmonary:     Effort: Pulmonary effort is normal. No respiratory distress.     Breath sounds: Normal breath sounds.  Abdominal:     General: Bowel sounds are normal.     Palpations: Abdomen is soft.     Tenderness: There is no abdominal tenderness. There is no guarding or rebound.  Musculoskeletal:     Cervical back: Neck supple.  Skin:    General: Skin is warm and dry.  Neurological:     Mental Status: She is alert.  Psychiatric:        Mood and Affect: Mood normal.        Behavior: Behavior normal.      UC Treatments / Results  Labs (all labs ordered are listed, but only abnormal results are displayed) Labs Reviewed - No data to display  EKG   Radiology No results found.  Procedures Procedures (including critical care time)  Medications  Ordered in UC Medications - No data to display  Initial Impression / Assessment and Plan / UC Course  I have reviewed the triage vital signs and the nursing notes.  Pertinent  labs & imaging results that were available during my care of the patient were reviewed by me and considered in my medical decision making (see chart for details).    Viral illness.  Afebrile, VSS.  Patient already has Zofran that was recently prescribed.  Discussed clear liquid diet and maintaining oral hydration at home.  ED precautions discussed.  Education provided on viral illness.  Instructed patient to follow-up with her PCP as needed.  She agrees to plan of care.   Final Clinical Impressions(s) / UC Diagnoses   Final diagnoses:  Viral illness     Discharge Instructions      Keep yourself hydrated with clear liquids, such as water and Gatorade.  Follow the diarrhea diet as tolerated.   Go to the emergency department if you have worsening symptoms.    Follow up with your primary care provider.        ED Prescriptions   None    PDMP not reviewed this encounter.   Mickie Bail, NP 04/06/23 [phone]

## 2023-04-08 ENCOUNTER — Ambulatory Visit: Payer: Self-pay

## 2023-06-22 ENCOUNTER — Encounter: Payer: Self-pay | Admitting: Emergency Medicine

## 2023-06-22 ENCOUNTER — Ambulatory Visit
Admission: EM | Admit: 2023-06-22 | Discharge: 2023-06-22 | Disposition: A | Discharge status: Home / Self Care | Payer: BC Managed Care – PPO

## 2023-06-22 DIAGNOSIS — M5441 Lumbago with sciatica, right side: Secondary | ICD-10-CM

## 2023-06-22 DIAGNOSIS — G8929 Other chronic pain: Secondary | ICD-10-CM | POA: Diagnosis not present

## 2023-06-22 DIAGNOSIS — M5431 Sciatica, right side: Secondary | ICD-10-CM

## 2023-06-22 MED ORDER — PREDNISONE 10 MG PO TABS: ORAL_TABLET | ORAL | 0 refills | Status: AC

## 2023-06-22 MED ORDER — KETOROLAC TROMETHAMINE 60 MG/2ML IM SOLN
30.0000 mg | Freq: Once | INTRAMUSCULAR | Status: AC
  Administered 2023-06-22: 30 mg via INTRAMUSCULAR

## 2023-06-22 MED ORDER — CHLORZOXAZONE 500 MG PO TABS: 500.0000 mg | ORAL_TABLET | Freq: Four times a day (QID) | ORAL | 1 refills | Status: AC | PRN

## 2023-06-22 MED ORDER — GABAPENTIN 300 MG PO CAPS
ORAL_CAPSULE | ORAL | 1 refills | Status: AC
Start: 2023-06-22 — End: ?

## 2023-06-22 NOTE — ED Provider Notes (Signed)
[facility]    CSN: [id number] Arrival date & time: 06/22/23  1450      History   Chief Complaint Chief Complaint  Patient presents with   Leg Pain    HPI Christina Huang is a 37 y.o. female presenting for right-sided sciatica which has become worse over the past 1 week.  She reports she was stepping down on some stairs but believes she stepped out of too hard which caused her hip to twist.  Since then she has had significant pain of the entire right leg to the foot.  Movements of hip and raising the leg caused the pain to be worse.  Dorsiflexion of foot increases pain.  Sitting, standing, position changes and lying down worsen pain.  She reports a history of sciatica off and on for at least the past 1-1/2 years.  Has an appointment for physical therapy in about 3 days for this.  She saw her PCP back in March and the referral was placed.  She missed her first appointment but had planned to attend her first session in 3 days.  She has not seen orthopedics regarding this issue.  Reports that she normally takes over-the-counter ibuprofen and Tylenol which helps previously but nothing is helping the pain at this point.  She is denying any numbness, weakness or tingling, loss of bowel or bladder control.  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      Home Medications    Prior to Admission medications   Medication Sig Start Date  End Date Taking? Authorizing Provider  chlorzoxazone (PARAFON) 500 MG tablet Take 1 tablet (500 mg total) by mouth 4 (four) times daily as needed for muscle spasms. 06/22/23  Yes Eusebio Friendly B, PA-C  gabapentin (NEURONTIN) 300 MG capsule Take 1 cap p.o. at bedtime x 1 day and then increase to 1 cap twice daily thereafter. 06/22/23  Yes Shirlee Latch, PA-C  predniSONE (DELTASONE) 10 MG tablet Take 6 tabs p.o. on day 1 and decrease by 1 tablet daily until complete 06/22/23  Yes Shirlee Latch, PA-C  Relugolix-Estradiol-Norethind (MYFEMBREE) 40-1-0.5 MG TABS Take 1 tablet by mouth daily. 05/28/23  Yes [provider]  levonorgestrel (MIRENA) 20 MCG/DAY IUD 1 each by Intrauterine route once.    [provider]  norethindrone (AYGESTIN) 5 MG tablet Take by mouth. 02/06/23 03/26/24  [provider]    Family History Family History  Problem Relation Age of Onset   Healthy Mother    Healthy Father     Social History Social History   Tobacco Use   Smoking status: Never   Smokeless tobacco: Never  Vaping Use   Vaping status: Some Days  Substance Use Topics   Alcohol use: Yes    Comment: ocassional   Drug use: Yes    Types: Marijuana     Allergies   Patient has no known allergies.  Review of Systems Review of Systems  Musculoskeletal:  Positive for arthralgias (right hip/leg) and back pain. Negative for gait problem and joint swelling.  Neurological:  Negative for weakness and numbness.     Physical Exam Triage Vital Signs ED Triage Vitals  Encounter Vitals Group     BP      Systolic BP Percentile      Diastolic BP Percentile      Pulse      Resp      Temp      Temp src      SpO2      Weight      Height      Head Circumference      Peak Flow      Pain Score      Pain Loc      Pain Education      Exclude from Growth Chart    No data found.  Updated Vital Signs BP 115/76 (BP Location: Right Arm)   Pulse 76   Temp 98.3 F (36.8 C)  (Oral)   Resp 16   SpO2 98%   Physical Exam Vitals and nursing note reviewed.  Constitutional:      General: She is not in acute distress.    Appearance: Normal appearance. She is not ill-appearing or toxic-appearing.  HENT:     Head: Normocephalic and atraumatic.  Eyes:     General: No scleral icterus.       Right eye: No discharge.        Left eye: No discharge.     Conjunctiva/sclera: Conjunctivae normal.  Cardiovascular:     Rate and Rhythm: Normal rate and regular rhythm.     Heart sounds: Normal heart sounds.  Pulmonary:     Effort: Pulmonary effort is normal. No respiratory distress.     Breath sounds: Normal breath sounds.  Musculoskeletal:     Cervical back: Neck supple.     Lumbar back: Tenderness (bilateral paralumbar muscles, right glutes/lateral hip) present. No bony tenderness. Decreased range of motion. Positive right straight leg raise test. Negative left straight leg raise test.     Comments: Patient becomes tearful with straight leg raise even just a little off the table.  Skin:    General: Skin is dry.  Neurological:     General: No focal deficit present.     Mental Status: She is alert. Mental status is at baseline.     Motor: No weakness.     Gait: Gait normal.  Psychiatric:        Mood and Affect: Mood normal.        Behavior: Behavior normal.        Thought Content: Thought content normal.      UC Treatments / Results  Labs (all labs ordered are listed, but only abnormal results are displayed) Labs Reviewed - No data to display  EKG   Radiology No results found.  Procedures Procedures (including critical care time)  Medications Ordered in UC Medications  ketorolac (TORADOL) injection 30 mg (30 mg Intramuscular Given 06/22/23 1618)    Initial Impression / Assessment and Plan / UC Course  I have reviewed the triage vital signs and the nursing notes.  Pertinent labs & imaging results that were available during my care of the patient  were reviewed by me and considered in my medical decision making (see chart for details).   37 year old female with history of sciatica for the past 1-1/2 years presents for  1 week history of worsening of symptoms after twisting her hip.  Has an appointment for physical therapy in 3 days.  Has been taking OTC meds without relief.  No red flag signs or symptoms.  Has never been seen by orthopedics.  On exam has a positive SLR with barely lifting the leg and with dorsiflexion of foot.  Tenderness of bilateral paralumbar muscles but more so on the right and of the right glutes and right lateral hip.  Reduced range of motion of back.  Painful movement of right hip.  Chronic sciatica which is recently acutely flared.  Patient given 30 mg IM ketorolac in clinic for acute pain relief.  Sent prednisone taper, chlorzoxazone to pharmacy.  Discussed pain relieving medications with patient.  She has a strong aversion to any narcotics given that she had a cousin passed away from accidental overdose.  Discussed use of gabapentin with patient and sent this to the pharmacy.  Information given for her to follow-up with orthopedics as she likely needs an MRI at this point.  ED precautions thoroughly discussed.   Final Clinical Impressions(s) / UC Diagnoses   Final diagnoses:  Sciatica of right side  Chronic bilateral low back pain with right-sided sciatica     Discharge Instructions      -You have been given an injection of an anti-inflammatory medication in the clinic today for sciatica. - Since you have had these symptoms off and on for quite a long time I would like you to follow-up with orthopedics.  Contact one of the offices below. - I sent another prednisone taper and muscle relaxers for you. - I also sent gabapentin for you to start.  If you need this medication longer term you should discuss it with your PCP or orthopedics.  You have a condition requiring you to follow up with Orthopedics so please  call one of the following office for appointment:   [facility] Address: [address], [place], [place] [address] Phone: [phone]  [facility] [address] [place], [place], [place] [address] Phone: [phone]  [facility] [address], [place], [place] [address] Phone: [phone]   BACK PAIN RED FLAGS: If the back pain acutely worsens or there are any red flag symptoms such as numbness/tingling, leg weakness, saddle anesthesia, or loss of bowel/bladder control, go immediately to the ER. Follow up with [place] as scheduled or sooner if the pain does not begin to resolve or if it worsens before the follow up       ED Prescriptions     Medication Sig Dispense Auth. Provider   predniSONE (DELTASONE) 10 MG tablet Take 6 tabs p.o. on day 1 and decrease by 1 tablet daily until complete 21 tablet Eusebio Friendly B, PA-C   chlorzoxazone (PARAFON) 500 MG tablet Take 1 tablet (500 mg total) by mouth 4 (four) times daily as needed for muscle spasms. 30 tablet Eusebio Friendly B, PA-C   gabapentin (NEURONTIN) 300 MG capsule Take 1 cap p.o. at bedtime x 1 day and then increase to 1 cap twice daily thereafter. 60 capsule Shirlee Latch, PA-C      PDMP not reviewed this encounter.   Shirlee Latch, PA-C 06/22/23 1655

## 2023-06-22 NOTE — Discharge Instructions (Addendum)
-  You have been given an injection of an anti-inflammatory medication in the clinic today for sciatica. - Since you have had these symptoms off and on for quite a long time I would like you to follow-up with orthopedics.  Contact one of the offices below. - I sent another prednisone taper and muscle relaxers for you. - I also sent gabapentin for you to start.  If you need this medication longer term you should discuss it with your PCP or orthopedics.  You have a condition requiring you to follow up with Orthopedics so please call one of the following office for appointment:   [facility] Address: [address], [place], [place] [address] Phone: [phone]  [facility] [address] [place], [place], [place] [address] Phone: [phone]  [facility] [address] [place], [place] [address] Phone: [phone]   BACK PAIN RED FLAGS: If the back pain acutely worsens or there are any red flag symptoms such as numbness/tingling, leg weakness, saddle anesthesia, or loss of bowel/bladder control, go immediately to the ER. Follow up with [place] as scheduled or sooner if the pain does not begin to resolve or if it worsens before the follow up

## 2023-06-22 NOTE — ED Triage Notes (Signed)
Pt presents with right leg pain after stepping down wrong on her back steps x 1 week.

## 2023-06-28 IMAGING — CT CT HEAD W/O CM
4 series · 17 of 47 positions shown, 19 images · non-contrast
Comparison: CT head, 07/31/2010.

CLINICAL DATA: Headache.  Chronic.

EXAM:
CT HEAD WITHOUT CONTRAST
TECHNIQUE: Contiguous axial images were obtained from the base of the skull
through the vertex without intravenous contrast.

[Series 2: head wo · axial · 0.40mm/px · z∈[+258,+363]mm · 7 of 29 slices shown, 9 images]
[im 4/29  brain]
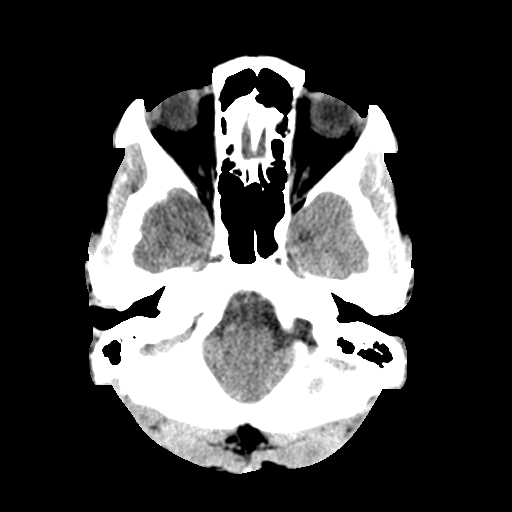
[im 4/29  bone]
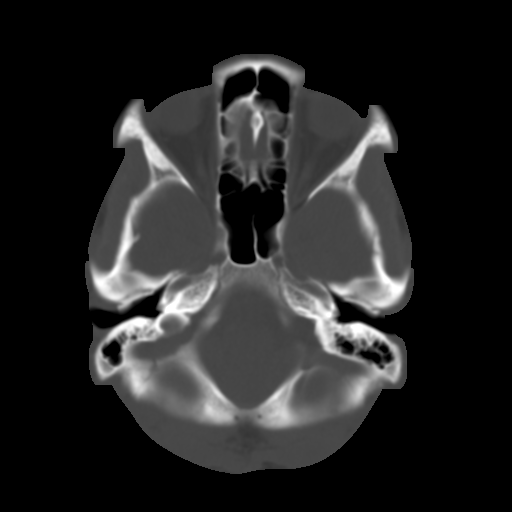
[im 8/29  brain]
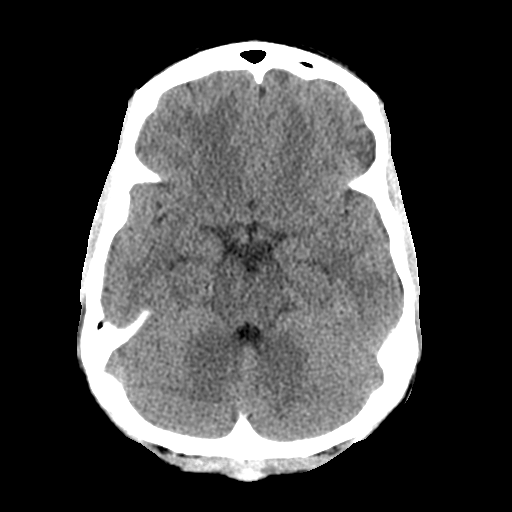
[im 11/29  brain]
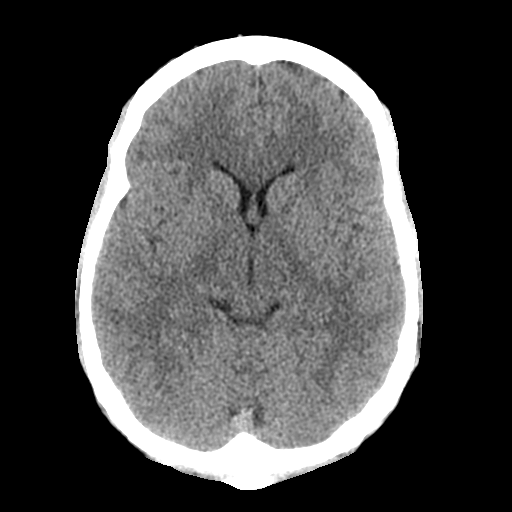
[im 15/29  brain]
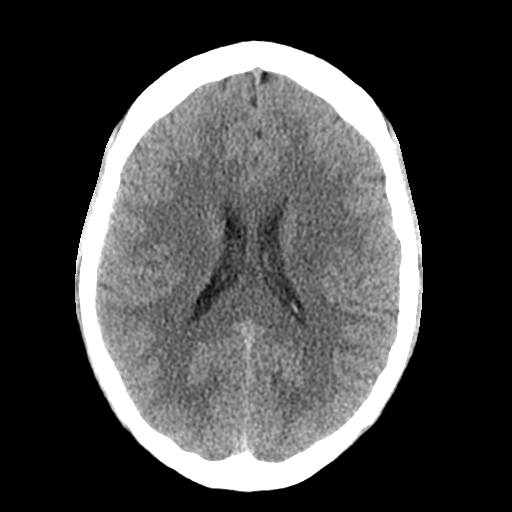
[im 18/29  brain]
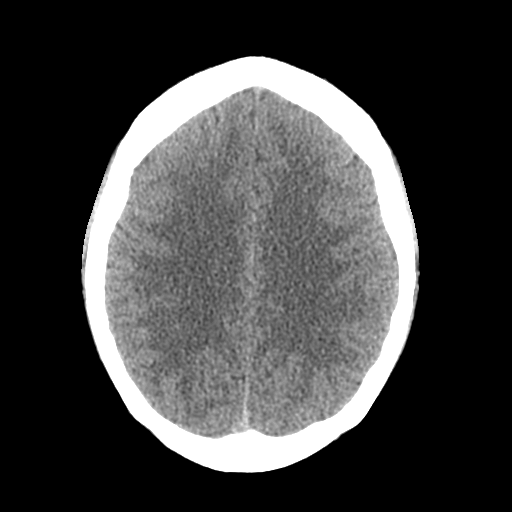
[im 18/29  bone]
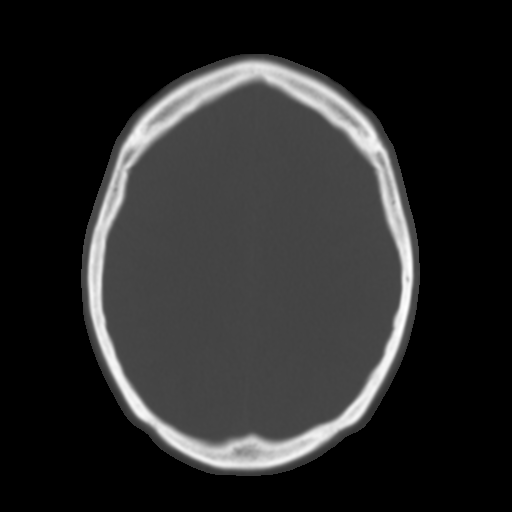
[im 22/29  brain]
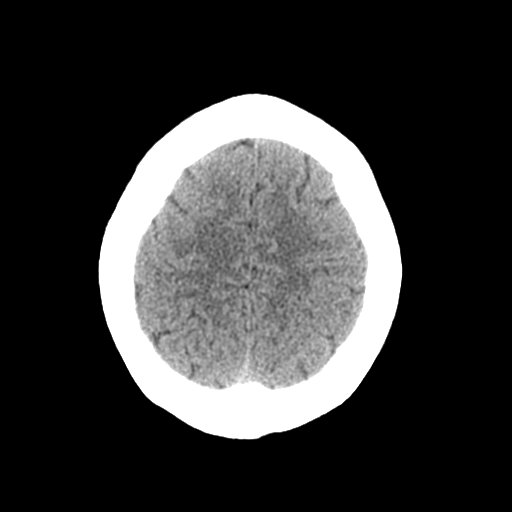
[im 25/29  brain]
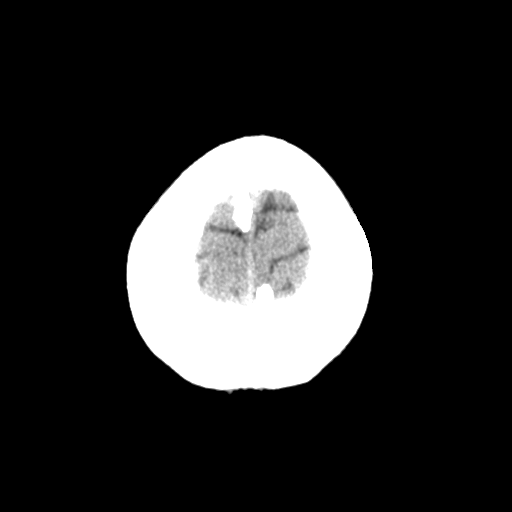

[Series 3: head bone · axial · 0.40mm/px · z∈[+257,+305]mm · 4 of 72 slices shown]
[im 8/72  bone]
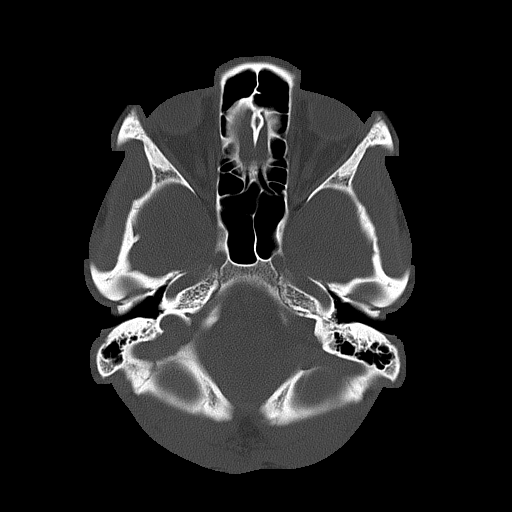
[im 15/72  bone]
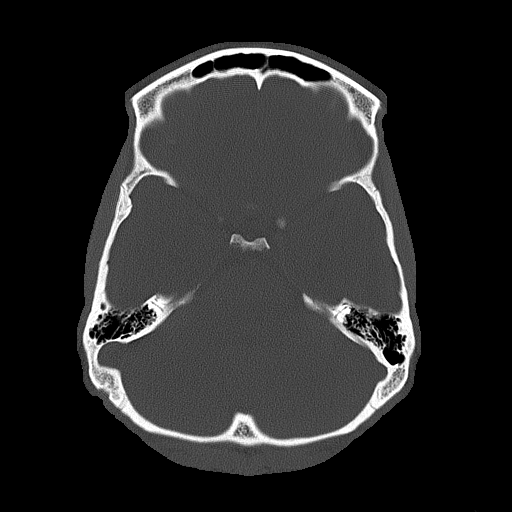
[im 22/72  bone]
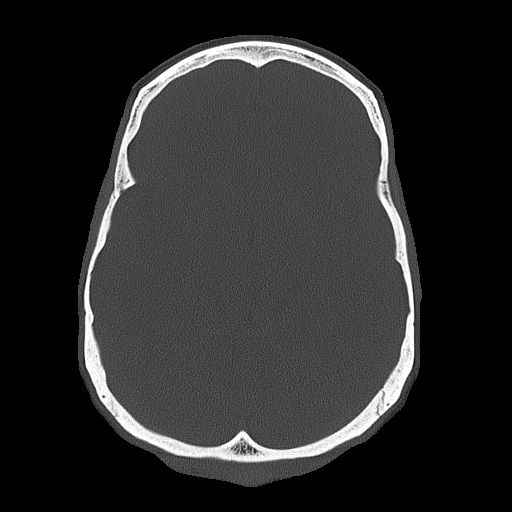
[im 32/72  bone]
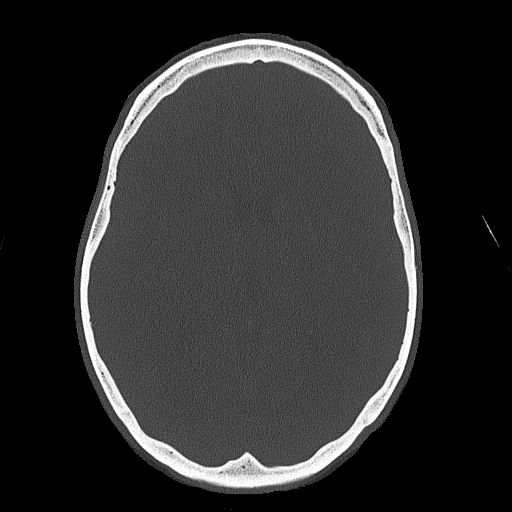

[Series 4: coronal soft tissue · coronal · 0.31mm/px · 3 of 65 slices shown]
[im 22/65  brain]
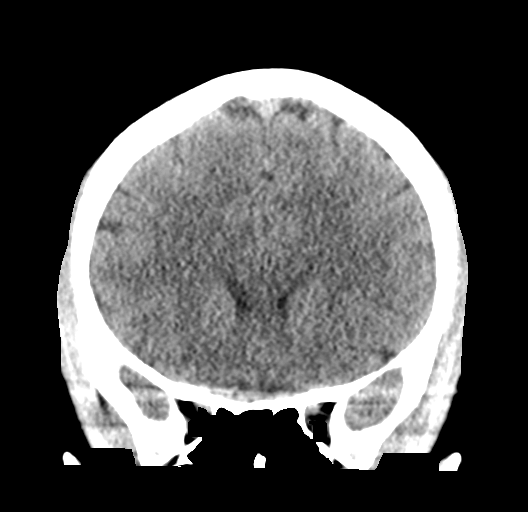
[im 29/65  brain]
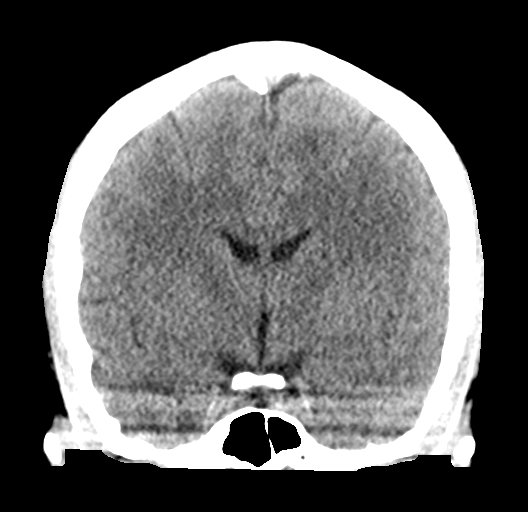
[im 36/65  brain]
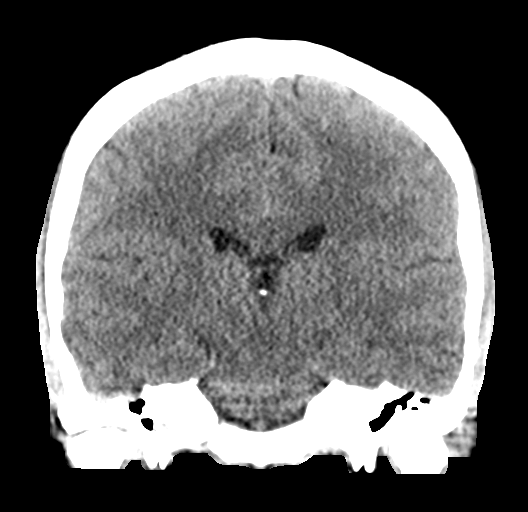

[Series 5: sagittal soft tissue · sagittal · 0.31mm/px · 3 of 55 slices shown]
[im 19/55  brain]
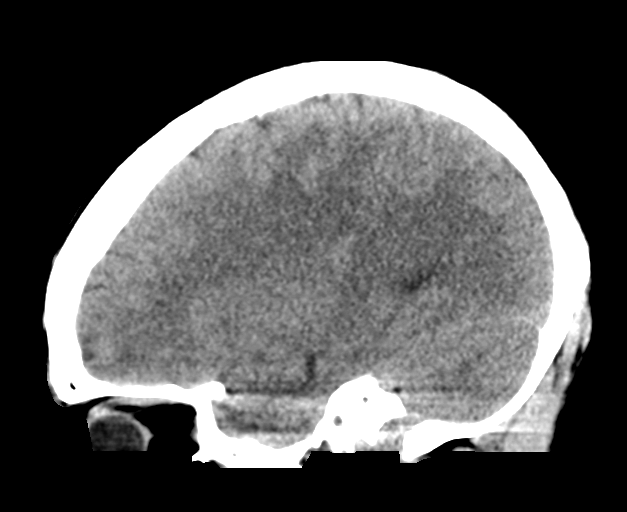
[im 28/55  brain]
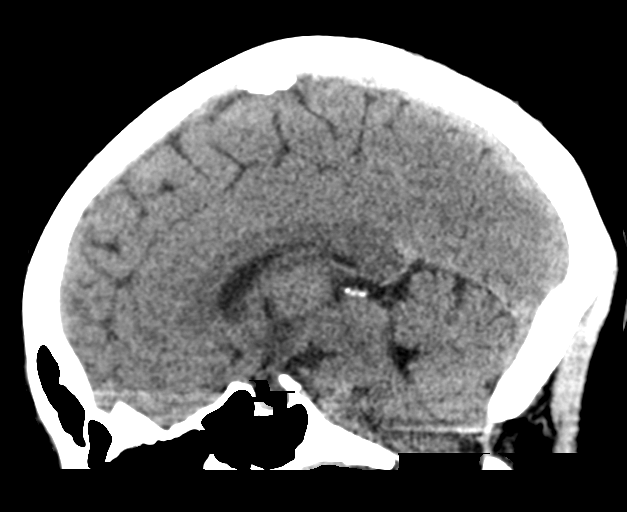
[im 37/55  brain]
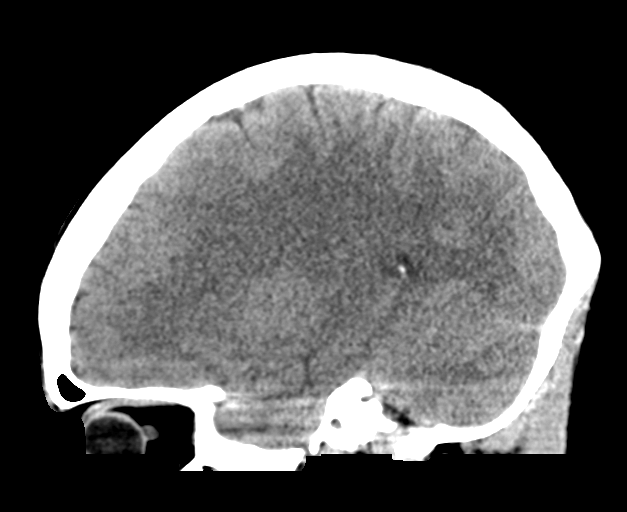

[17 of 47 positions shown; findings below may reference images not displayed]

FINDINGS: Brain: No evidence of acute infarction, hemorrhage, hydrocephalus,
extra-axial collection or mass lesion/mass effect.

Vascular: No hyperdense vessel or unexpected calcification.

Skull: Normal. Negative for fracture or focal lesion.

Sinuses/Orbits: No acute finding.

Other: None.
IMPRESSION: No acute intracranial abnormality.

## 2023-07-09 ENCOUNTER — Ambulatory Visit: Payer: BC Managed Care – PPO

## 2023-07-09 ENCOUNTER — Other Ambulatory Visit: Payer: Self-pay | Admitting: Physician Assistant

## 2023-07-09 DIAGNOSIS — R2241 Localized swelling, mass and lump, right lower limb: Secondary | ICD-10-CM

## 2023-07-10 ENCOUNTER — Ambulatory Visit
Admission: RE | Admit: 2023-07-10 | Discharge: 2023-07-10 | Disposition: A | Discharge status: Home / Self Care | Payer: BC Managed Care – PPO | Source: Ambulatory Visit | Attending: Physician Assistant | Admitting: Physician Assistant

## 2023-07-10 DIAGNOSIS — R2241 Localized swelling, mass and lump, right lower limb: Secondary | ICD-10-CM | POA: Diagnosis not present

## 2024-01-18 IMAGING — US US EXTREM LOW VENOUS*R*
1 series · 14 of 24 positions shown · non-contrast
Comparison: None.

CLINICAL DATA: Right leg pain

EXAM:
RIGHT LOWER EXTREMITY VENOUS DOPPLER ULTRASOUND
TECHNIQUE: Gray-scale sonography with compression, as well as color and duplex
ultrasound, were performed to evaluate the deep venous system(s)
from the level of the common femoral vein through the popliteal and
proximal calf veins.

[Series 1: us venous img lower uni right (dvt) · portal-venous · 14 of 33 slices shown]
[im 1/33]
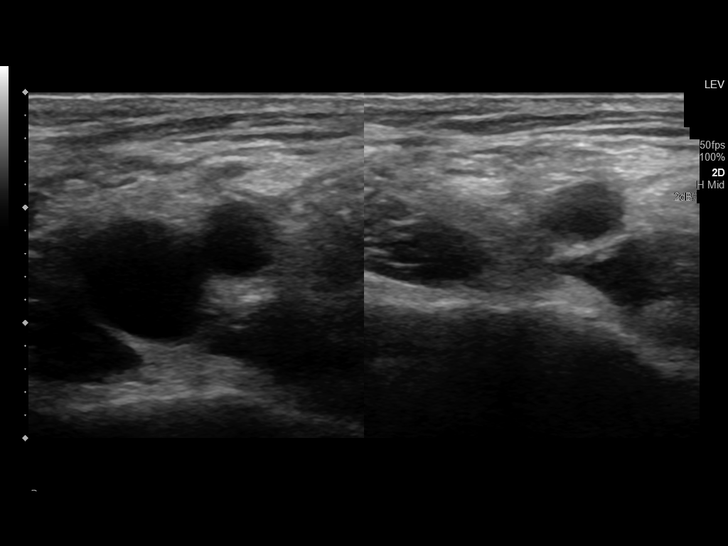
[im 3/33]
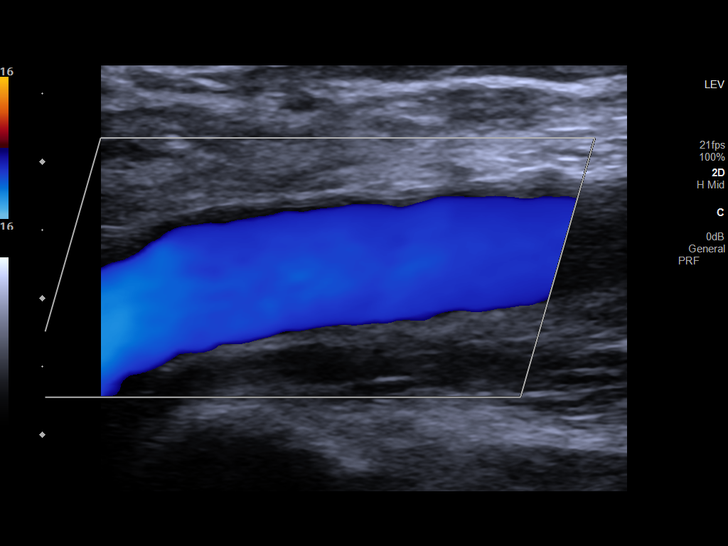
[im 6/33]
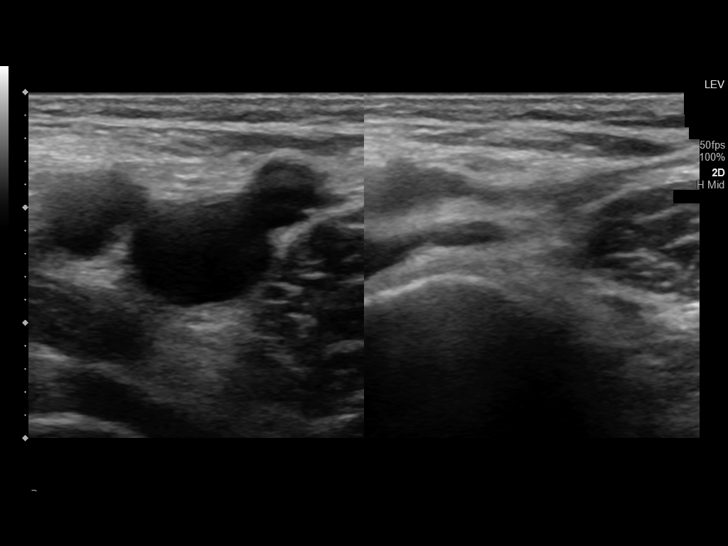
[im 9/33]
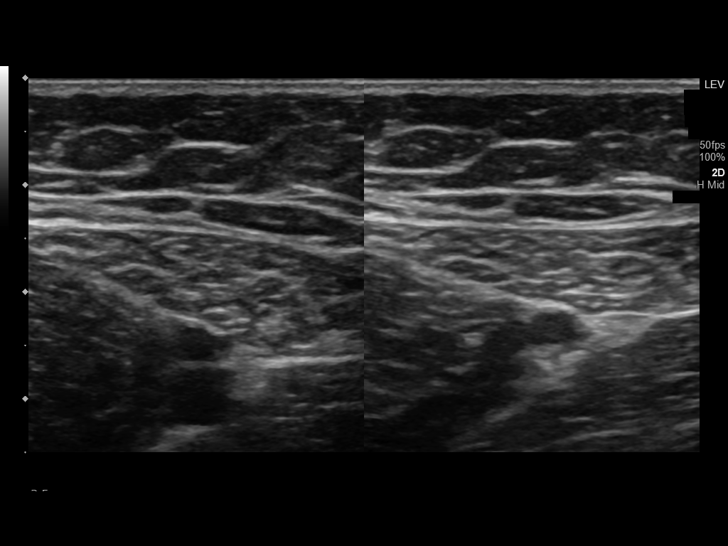
[im 10/33]
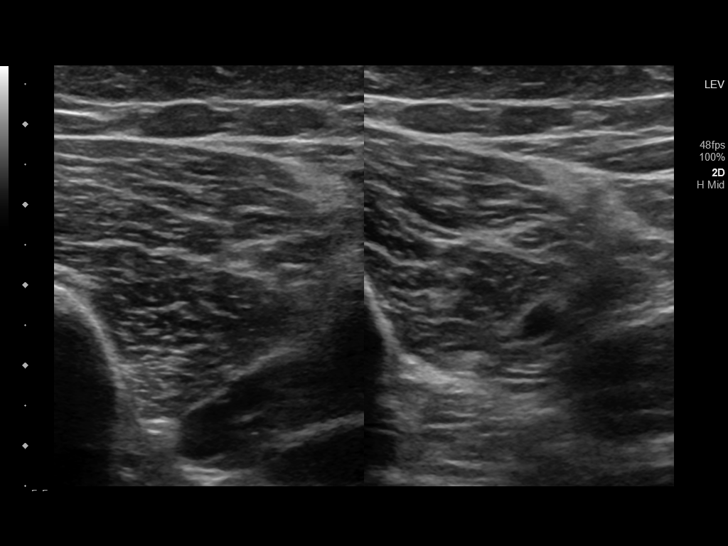
[im 13/33]
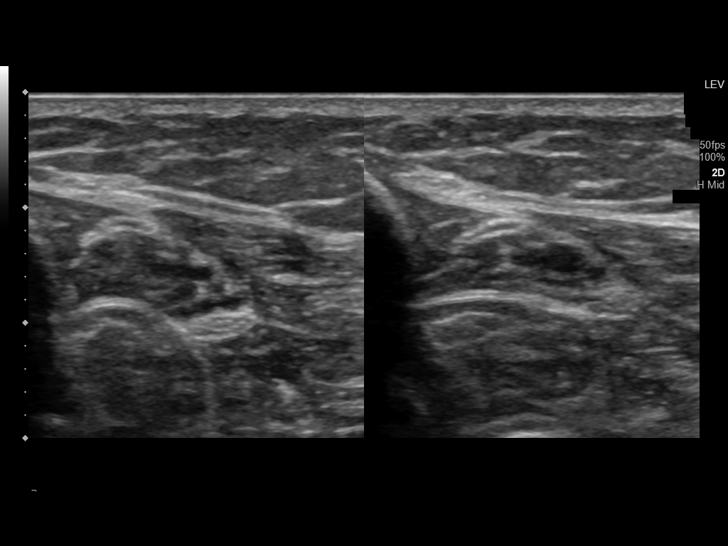
[im 16/33]
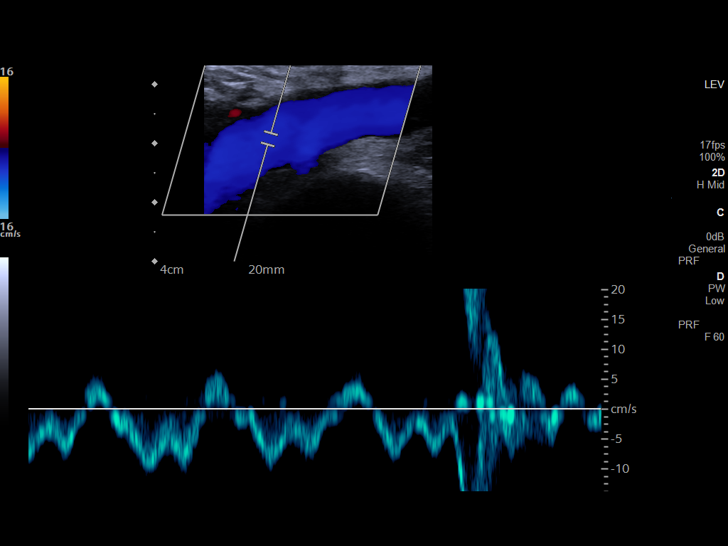
[im 17/33]
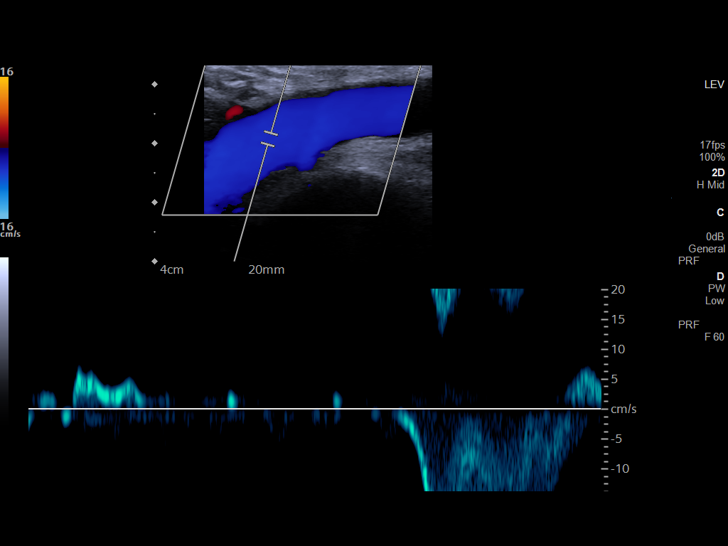
[im 20/33]
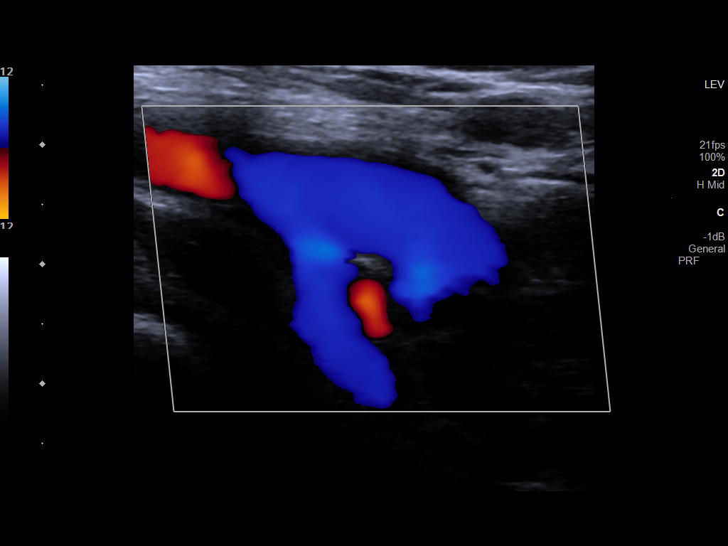
[im 23/33]
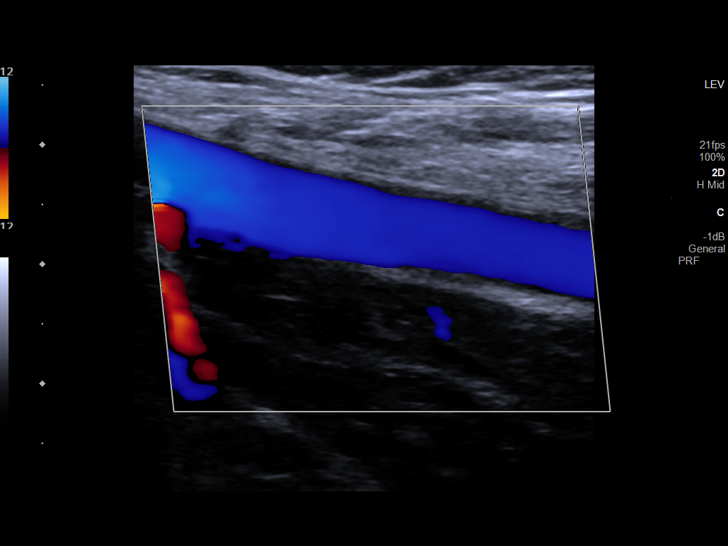
[im 26/33]
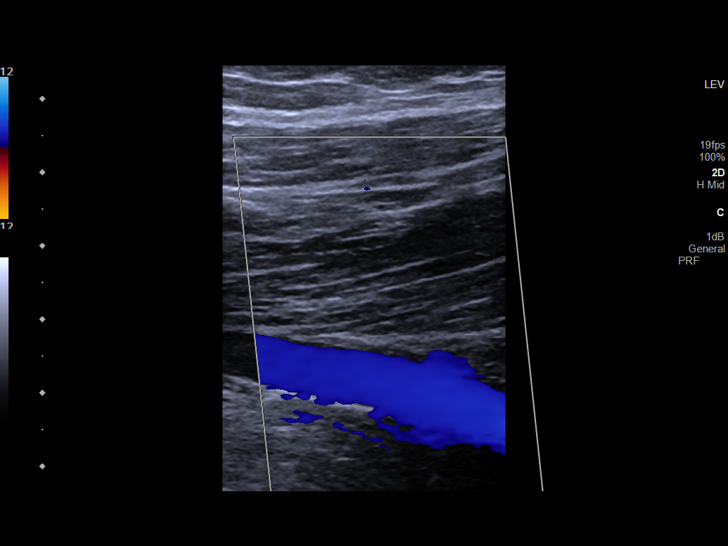
[im 27/33]
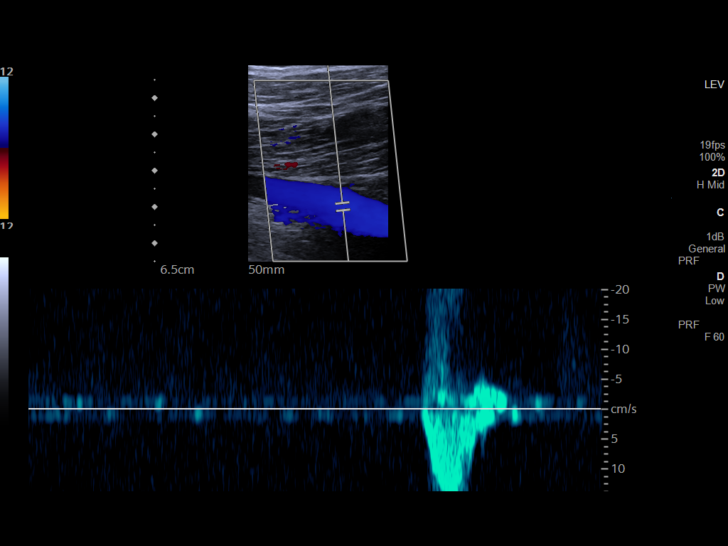
[im 30/33]
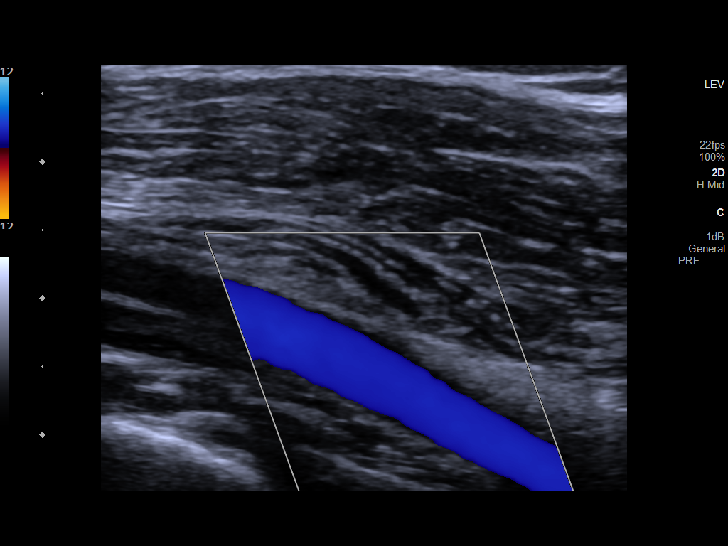
[im 33/33]
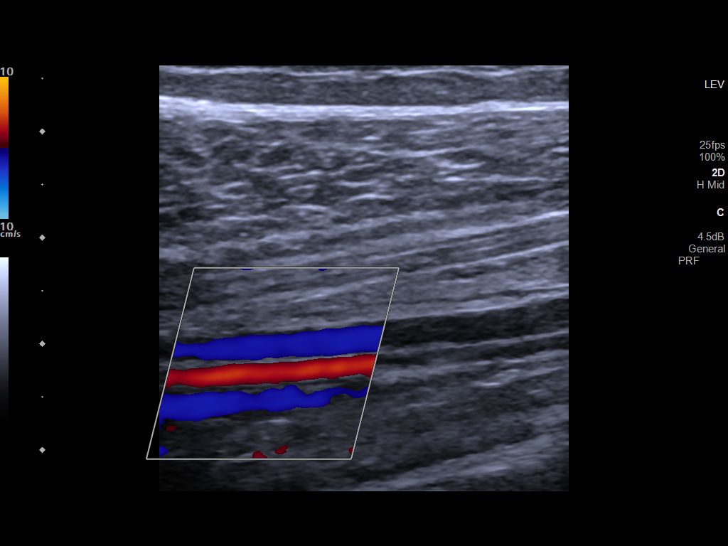

[14 of 24 positions shown; findings below may reference images not displayed]

FINDINGS: VENOUS

Normal compressibility of the common femoral, superficial femoral,
and popliteal veins, as well as the visualized calf veins.
Visualized portions of profunda femoral vein and great saphenous
vein unremarkable. No filling defects to suggest DVT on grayscale or
color Doppler imaging. Doppler waveforms show normal direction of
venous flow, normal respiratory plasticity and response to
augmentation.

Limited views of the contralateral common femoral vein are
unremarkable.

OTHER

None.

Limitations: none
IMPRESSION: Negative.

## 2024-08-19 ENCOUNTER — Ambulatory Visit: Payer: Self-pay
